# Patient Record
Sex: Male | Born: 1985 | Race: White | Hispanic: No | Marital: Married | Smoking: Never smoker
Health system: Southern US, Community
[De-identification: ages and names within clinical notes are randomized; demographics above are authoritative.]

## PROBLEM LIST (undated history)

## (undated) DIAGNOSIS — I10 Essential (primary) hypertension: Secondary | ICD-10-CM

## (undated) HISTORY — DX: Essential (primary) hypertension: I10

---

## 2017-08-22 DIAGNOSIS — L02223 Furuncle of chest wall: Secondary | ICD-10-CM | POA: Diagnosis not present

## 2017-09-26 NOTE — Progress Notes (Signed)
Subjective:    Patient ID: Jerry Dudley, male    DOB: Jan 22, 1986, 32 y.o.   MRN: [id number]  HPI:  Jerry Dudley is here to establish as a new pt.  He is a pleasant 32 year old male.  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 He drinks 4-6 glasses water/day, follows heart healthy diet, an exercises 4-5 times week; cardio and wt training.   He uses chewing tobacco- can/day- declined smoking cessation today. He estimates to drink 3-4 beers/week He feels that his PTSD is stable and denies thoughts of harming himself/others and has a strong support system of family/friends. He only has one acute complaint- developed abscess on sternum that was I/D last moth at [facility].  He completed course of Bactrim but would like a Dermatologist to examine- will refer. He served in [place] and now works as a Engineer, mining" and serves area hospital systems. He is married with a 17 month daughter, "Jerry Dudley".  Patient Care Team    Relationship Specialty Notifications Start End  [place], Berna Spare, NP PCP - General Family Medicine  09/27/17     There are no active problems to display for this patient.    Past Medical History:  Diagnosis Date  . Hypertension      History reviewed. No pertinent surgical history.   Family History  Problem Relation Age of Onset  . Healthy Mother   . Healthy Father   . Healthy Sister   . Healthy Daughter      Social History   Substance and Sexual Activity  Drug Use No     Social History   Substance and Sexual Activity  Alcohol Use Yes  . Alcohol/week: 1.2 oz  . Types: 2 Cans of beer per week     Social History   Tobacco Use  Smoking Status Never Smoker  Smokeless Tobacco Current User  . Types: Snuff     Outpatient Encounter Medications as of 09/27/2017  Medication Sig  . losartan (COZAAR) 100 MG tablet Take 50 mg by mouth daily.   No facility-administered encounter medications on file as of 09/27/2017.      Allergies: Patient has no known allergies.  Body mass index is 26.75 kg/m.  Blood pressure (!) 138/96, pulse (!) 102, height 6\' 3"  (1.905 m), weight 214 lb (97.1 kg), SpO2 100 %.      Review of Systems  Constitutional: Positive for fatigue. Negative for activity change, appetite change, chills, diaphoresis, fever and unexpected weight change.  HENT: Negative for congestion.   Eyes: Negative for visual disturbance.  Respiratory: Negative for cough, chest tightness, shortness of breath, wheezing and stridor.   Cardiovascular: Negative for chest pain, palpitations and leg swelling.  Gastrointestinal: Negative for abdominal distention, abdominal pain, blood in stool, constipation, diarrhea, nausea and vomiting.  Genitourinary: Negative for difficulty urinating and flank pain.  Skin: Positive for wound. Negative for color change, pallor and rash.       Healing sternal abscess   Neurological: Negative for dizziness and headaches.  Hematological: Does not bruise/bleed easily.  Psychiatric/Behavioral: Negative for decreased concentration, dysphoric mood, hallucinations, self-injury, sleep disturbance and suicidal ideas. The patient is not nervous/anxious and is not hyperactive.        Objective:   Physical Exam  Constitutional: He is oriented to person, place, and time. He appears well-developed and well-nourished. No distress.  Cardiovascular: Normal rate, regular rhythm, normal heart sounds and intact distal pulses.  No murmur heard. Pulmonary/Chest: Effort normal and  breath sounds normal. No respiratory distress. He has no wheezes. He has no rales. He exhibits no tenderness.  Neurological: He is alert and oriented to person, place, and time.  Skin: Skin is warm and dry. No rash noted. He is not diaphoretic. No erythema. No pallor.  Psychiatric: He has a normal mood and affect. His behavior is normal. Judgment and thought content normal.  Nursing note and vitals  reviewed.         Assessment & Plan:   1. Hypertension, unspecified type   2. Healthcare maintenance   3. Skin infection     Healthcare maintenance ncrease water intake, strive for at least 100 ounces/day.   Follow Heart Healthy diet Continue regular exercise.  Recommend at least 30 minutes daily, 5 days per week of walking, jogging, biking, swimming, YouTube/Pinterest workout videos. Continue Losartan 50mg  once daily. Please get your fasting labs from the [place] and schedule a complete physical with [place] a few weeks after- please bring lab results. Dermatology referral placed.  Skin infection Derm referral placed  Hypertension BP slightly above goal- 138/96, HR 102 Just used chewing tobacco prior to OV Continue Losartan 50mg  once daily- filled by [place] He denies acute cardiac sx's.    FOLLOW-UP:  Return in about 3 months (around 12/25/2017) for CPE.

## 2017-09-27 ENCOUNTER — Encounter: Payer: Self-pay | Admitting: Adult Health

## 2017-09-27 ENCOUNTER — Other Ambulatory Visit: Payer: Self-pay | Admitting: Adult Health

## 2017-09-27 ENCOUNTER — Ambulatory Visit (INDEPENDENT_AMBULATORY_CARE_PROVIDER_SITE_OTHER): Payer: 59 | Admitting: Adult Health

## 2017-09-27 VITALS — BP 138/96 | HR 102 | Ht 75.0 in | Wt 214.0 lb

## 2017-09-27 DIAGNOSIS — Z0000 Encounter for general adult medical examination without abnormal findings: Secondary | ICD-10-CM | POA: Diagnosis not present

## 2017-09-27 DIAGNOSIS — I10 Essential (primary) hypertension: Secondary | ICD-10-CM | POA: Diagnosis not present

## 2017-09-27 DIAGNOSIS — L089 Local infection of the skin and subcutaneous tissue, unspecified: Secondary | ICD-10-CM

## 2017-09-27 NOTE — Assessment & Plan Note (Signed)
ncrease water intake, strive for at least 100 ounces/day.   Follow Heart Healthy diet Continue regular exercise.  Recommend at least 30 minutes daily, 5 days per week of walking, jogging, biking, swimming, YouTube/Pinterest workout videos. Continue Losartan 50mg  once daily. Please get your fasting labs from the [place] and schedule a complete physical with [place] a few weeks after- please bring lab results. Dermatology referral placed.

## 2017-09-27 NOTE — Assessment & Plan Note (Signed)
Derm referral placed

## 2017-09-27 NOTE — Patient Instructions (Addendum)
Heart-Healthy Eating Plan Many factors influence your heart health, including eating and exercise habits. Heart (coronary) risk increases with abnormal blood fat (lipid) levels. Heart-healthy meal planning includes limiting unhealthy fats, increasing healthy fats, and making other small dietary changes. This includes maintaining a healthy body weight to help keep lipid levels within a normal range. What is my plan? Your health care provider recommends that you:  Get no more than __25__% of the total calories in your daily diet from fat.  Limit your intake of saturated fat to less than __5__% of your total calories each day.  Limit the amount of cholesterol in your diet to less than __300__ mg per day.  What types of fat should I choose?  Choose healthy fats more often. Choose monounsaturated and polyunsaturated fats, such as olive oil and canola oil, flaxseeds, walnuts, almonds, and seeds.  Eat more omega-3 fats. Good choices include salmon, mackerel, sardines, tuna, flaxseed oil, and ground flaxseeds. Aim to eat fish at least two times each week.  Limit saturated fats. Saturated fats are primarily found in animal products, such as meats, butter, and cream. Plant sources of saturated fats include palm oil, palm kernel oil, and coconut oil.  Avoid foods with partially hydrogenated oils in them. These contain trans fats. Examples of foods that contain trans fats are stick margarine, some tub margarines, cookies, crackers, and other baked goods. What general guidelines do I need to follow?  Check food labels carefully to identify foods with trans fats or high amounts of saturated fat.  Fill one half of your plate with vegetables and green salads. Eat 4-5 servings of vegetables per day. A serving of vegetables equals 1 cup of raw leafy vegetables,  cup of raw or cooked cut-up vegetables, or  cup of vegetable juice.  Fill one fourth of your plate with whole grains. Look for the word "whole" as  the first word in the ingredient list.  Fill one fourth of your plate with lean protein foods.  Eat 4-5 servings of fruit per day. A serving of fruit equals one medium whole fruit,  cup of dried fruit,  cup of fresh, frozen, or canned fruit, or  cup of 100% fruit juice.  Eat more foods that contain soluble fiber. Examples of foods that contain this type of fiber are apples, broccoli, carrots, beans, peas, and barley. Aim to get 20-30 g of fiber per day.  Eat more home-cooked food and less restaurant, buffet, and fast food.  Limit or avoid alcohol.  Limit foods that are high in starch and sugar.  Avoid fried foods.  Cook foods by using methods other than frying. Baking, boiling, grilling, and broiling are all great options. Other fat-reducing suggestions include: ? Removing the skin from poultry. ? Removing all visible fats from meats. ? Skimming the fat off of stews, soups, and gravies before serving them. ? Steaming vegetables in water or broth.  Lose weight if you are overweight. Losing just 5-10% of your initial body weight can help your overall health and prevent diseases such as diabetes and heart disease.  Increase your consumption of nuts, legumes, and seeds to 4-5 servings per week. One serving of dried beans or legumes equals  cup after being cooked, one serving of nuts equals 1 ounces, and one serving of seeds equals  ounce or 1 tablespoon.  You may need to monitor your salt (sodium) intake, especially if you have high blood pressure. Talk with your health care provider or dietitian to get  more information about reducing sodium. What foods can I eat? Grains  Breads, including [place], white, pita, wheat, raisin, rye, oatmeal, and [place]. Tortillas that are neither fried nor made with lard or trans fat. Low-fat rolls, including hotdog and hamburger buns and English muffins. Biscuits. Muffins. Waffles. Pancakes. Light popcorn. Whole-grain cereals. Flatbread. Melba toast.  Pretzels. Breadsticks. Rusks. Low-fat snacks and crackers, including oyster, saltine, matzo, graham, animal, and rye. Rice and pasta, including brown rice and those that are made with whole wheat. Vegetables All vegetables. Fruits All fruits, but limit coconut. Meats and Other Protein Sources Lean, well-trimmed beef, veal, pork, and lamb. Chicken and [place] without skin. All fish and shellfish. Wild duck, rabbit, pheasant, and venison. Egg whites or low-cholesterol egg substitutes. Dried beans, peas, lentils, and tofu.Seeds and most nuts. Dairy Low-fat or nonfat cheeses, including ricotta, string, and mozzarella. Skim or 1% milk that is liquid, powdered, or evaporated. Buttermilk that is made with low-fat milk. Nonfat or low-fat yogurt. Beverages Mineral water. Diet carbonated beverages. Sweets and Desserts Sherbets and fruit ices. Honey, jam, marmalade, jelly, and syrups. Meringues and gelatins. Pure sugar candy, such as hard candy, jelly beans, gumdrops, mints, marshmallows, and small amounts of dark chocolate. [employer] Eat all sweets and desserts in moderation. Fats and Oils Nonhydrogenated (trans-free) margarines. Vegetable oils, including soybean, sesame, sunflower, olive, peanut, safflower, corn, canola, and cottonseed. Salad dressings or mayonnaise that are made with a vegetable oil. Limit added fats and oils that you use for cooking, baking, salads, and as spreads. Other Cocoa powder. Coffee and tea. All seasonings and condiments. The items listed above may not be a complete list of recommended foods or beverages. Contact your dietitian for more options. What foods are not recommended? Grains Breads that are made with saturated or trans fats, oils, or whole milk. Croissants. Butter rolls. Cheese breads. Sweet rolls. Donuts. Buttered popcorn. Chow mein noodles. High-fat crackers, such as cheese or butter crackers. Meats and Other Protein Sources Fatty meats, such as hotdogs,  short ribs, sausage, spareribs, bacon, ribeye roast or steak, and mutton. High-fat deli meats, such as salami and bologna. Caviar. Domestic duck and goose. Organ meats, such as kidney, liver, sweetbreads, brains, gizzard, chitterlings, and heart. Dairy Cream, sour cream, cream cheese, and creamed cottage cheese. Whole milk cheeses, including blue (bleu), Monterey Jack, [place], [place], American, [place], Swiss, [place], [place], and [place]. Whole or 2% milk that is liquid, evaporated, or condensed. Whole buttermilk. Cream sauce or high-fat cheese sauce. Yogurt that is made from whole milk. Beverages Regular sodas and drinks with added sugar. Sweets and Desserts Frosting. Pudding. Cookies. Cakes other than angel food cake. Candy that has milk chocolate or white chocolate, hydrogenated fat, butter, coconut, or unknown ingredients. Buttered syrups. Full-fat ice cream or ice cream drinks. Fats and Oils Gravy that has suet, meat fat, or shortening. Cocoa butter, hydrogenated oils, palm oil, coconut oil, palm kernel oil. These can often be found in baked products, candy, fried foods, nondairy creamers, and whipped toppings. Solid fats and shortenings, including bacon fat, salt pork, lard, and butter. Nondairy cream substitutes, such as coffee creamers and sour cream substitutes. Salad dressings that are made of unknown oils, cheese, or sour cream. The items listed above may not be a complete list of foods and beverages to avoid. Contact your dietitian for more information. This information is not intended to replace advice given to you by your health care provider. Make sure you discuss any questions you have with your health care  provider. Document Released: 05/16/2008 Document Revised: 02/25/2016 Document Reviewed: 01/29/2014 Elsevier Interactive Patient Education  2018 [employer].   Hypertension Hypertension, commonly called high blood pressure, is when the force of blood pumping through the arteries  is too strong. The arteries are the blood vessels that carry blood from the heart throughout the body. Hypertension forces the heart to work harder to pump blood and may cause arteries to become narrow or stiff. Having untreated or uncontrolled hypertension can cause heart attacks, strokes, kidney disease, and other problems. A blood pressure reading consists of a higher number over a lower number. Ideally, your blood pressure should be below 120/80. The first ("top") number is called the systolic pressure. It is a measure of the pressure in your arteries as your heart beats. The second ("bottom") number is called the diastolic pressure. It is a measure of the pressure in your arteries as the heart relaxes. What are the causes? The cause of this condition is not known. What increases the risk? Some risk factors for high blood pressure are under your control. Others are not. Factors you can change  Smoking.  Having type 2 diabetes mellitus, high cholesterol, or both.  Not getting enough exercise or physical activity.  Being overweight.  Having too much fat, sugar, calories, or salt (sodium) in your diet.  Drinking too much alcohol. Factors that are difficult or impossible to change  Having chronic kidney disease.  Having a family history of high blood pressure.  Age. Risk increases with age.  Race. You may be at higher risk if you are African-American.  Gender. Men are at higher risk than women before age 71. After age 86, women are at higher risk than men.  Having obstructive sleep apnea.  Stress. What are the signs or symptoms? Extremely high blood pressure (hypertensive crisis) may cause:  Headache.  Anxiety.  Shortness of breath.  Nosebleed.  Nausea and vomiting.  Severe chest pain.  Jerky movements you cannot control (seizures).  How is this diagnosed? This condition is diagnosed by measuring your blood pressure while you are seated, with your arm resting on a  surface. The cuff of the blood pressure monitor will be placed directly against the skin of your upper arm at the level of your heart. It should be measured at least twice using the same arm. Certain conditions can cause a difference in blood pressure between your right and left arms. Certain factors can cause blood pressure readings to be lower or higher than normal (elevated) for a short period of time:  When your blood pressure is higher when you are in a health care provider's office than when you are at home, this is called white coat hypertension. Most people with this condition do not need medicines.  When your blood pressure is higher at home than when you are in a health care provider's office, this is called masked hypertension. Most people with this condition may need medicines to control blood pressure.  If you have a high blood pressure reading during one visit or you have normal blood pressure with other risk factors:  You may be asked to return on a different day to have your blood pressure checked again.  You may be asked to monitor your blood pressure at home for 1 week or longer.  If you are diagnosed with hypertension, you may have other blood or imaging tests to help your health care provider understand your overall risk for other conditions. How is this treated? This  condition is treated by making healthy lifestyle changes, such as eating healthy foods, exercising more, and reducing your alcohol intake. Your health care provider may prescribe medicine if lifestyle changes are not enough to get your blood pressure under control, and if:  Your systolic blood pressure is above 130.  Your diastolic blood pressure is above 80.  Your personal target blood pressure may vary depending on your medical conditions, your age, and other factors. Follow these instructions at home: Eating and drinking  Eat a diet that is high in fiber and potassium, and low in sodium, added sugar, and  fat. An example eating plan is called the DASH (Dietary Approaches to Stop Hypertension) diet. To eat this way: ? Eat plenty of fresh fruits and vegetables. Try to fill half of your plate at each meal with fruits and vegetables. ? Eat whole grains, such as whole wheat pasta, brown rice, or whole grain bread. Fill about one quarter of your plate with whole grains. ? Eat or drink low-fat dairy products, such as skim milk or low-fat yogurt. ? Avoid fatty cuts of meat, processed or cured meats, and poultry with skin. Fill about one quarter of your plate with lean proteins, such as fish, chicken without skin, beans, eggs, and tofu. ? Avoid premade and processed foods. These tend to be higher in sodium, added sugar, and fat.  Reduce your daily sodium intake. Most people with hypertension should eat less than 1,500 mg of sodium a day.  Limit alcohol intake to no more than 1 drink a day for nonpregnant women and 2 drinks a day for men. One drink equals 12 oz of beer, 5 oz of wine, or 1 oz of hard liquor. Lifestyle  Work with your health care provider to maintain a healthy body weight or to lose weight. Ask what an ideal weight is for you.  Get at least 30 minutes of exercise that causes your heart to beat faster (aerobic exercise) most days of the week. Activities may include walking, swimming, or biking.  Include exercise to strengthen your muscles (resistance exercise), such as pilates or lifting weights, as part of your weekly exercise routine. Try to do these types of exercises for 30 minutes at least 3 days a week.  Do not use any products that contain nicotine or tobacco, such as cigarettes and e-cigarettes. If you need help quitting, ask your health care provider.  Monitor your blood pressure at home as told by your health care provider.  Keep all follow-up visits as told by your health care provider. This is important. Medicines  Take over-the-counter and prescription medicines only as told  by your health care provider. Follow directions carefully. Blood pressure medicines must be taken as prescribed.  Do not skip doses of blood pressure medicine. Doing this puts you at risk for problems and can make the medicine less effective.  Ask your health care provider about side effects or reactions to medicines that you should watch for. Contact a health care provider if:  You think you are having a reaction to a medicine you are taking.  You have headaches that keep coming back (recurring).  You feel dizzy.  You have swelling in your ankles.  You have trouble with your vision. Get help right away if:  You develop a severe headache or confusion.  You have unusual weakness or numbness.  You feel faint.  You have severe pain in your chest or abdomen.  You vomit repeatedly.  You have trouble breathing.  Summary  Hypertension is when the force of blood pumping through your arteries is too strong. If this condition is not controlled, it may put you at risk for serious complications.  Your personal target blood pressure may vary depending on your medical conditions, your age, and other factors. For most people, a normal blood pressure is less than 120/80.  Hypertension is treated with lifestyle changes, medicines, or a combination of both. Lifestyle changes include weight loss, eating a healthy, low-sodium diet, exercising more, and limiting alcohol. This information is not intended to replace advice given to you by your health care provider. Make sure you discuss any questions you have with your health care provider. Document Released: 08/07/2005 Document Revised: 07/05/2016 Document Reviewed: 07/05/2016 Elsevier Interactive Patient Education  2018 [employer].  Increase water intake, strive for at least 100 ounces/day.   Follow Heart Healthy diet Continue regular exercise.  Recommend at least 30 minutes daily, 5 days per week of walking, jogging, biking, swimming,  YouTube/Pinterest workout videos. Continue Losartan 50mg  once daily. Please get your fasting labs from the [place] and schedule a complete physical with [place] a few weeks after- please bring lab results. Dermatology referral placed. WELCOME TO THE PRACTICE

## 2017-09-27 NOTE — Assessment & Plan Note (Signed)
BP slightly above goal- 138/96, HR 102 Just used chewing tobacco prior to OV Continue Losartan 50mg  once daily- filled by [place] He denies acute cardiac sx's.

## 2017-10-30 DIAGNOSIS — D485 Neoplasm of uncertain behavior of skin: Secondary | ICD-10-CM | POA: Diagnosis not present

## 2017-10-30 DIAGNOSIS — D225 Melanocytic nevi of trunk: Secondary | ICD-10-CM | POA: Diagnosis not present

## 2017-10-30 DIAGNOSIS — Z872 Personal history of diseases of the skin and subcutaneous tissue: Secondary | ICD-10-CM | POA: Diagnosis not present

## 2017-11-26 ENCOUNTER — Ambulatory Visit: Payer: 59 | Admitting: Adult Health

## 2017-11-26 DIAGNOSIS — J069 Acute upper respiratory infection, unspecified: Secondary | ICD-10-CM | POA: Diagnosis not present

## 2017-11-26 DIAGNOSIS — H7292 Unspecified perforation of tympanic membrane, left ear: Secondary | ICD-10-CM | POA: Diagnosis not present

## 2017-11-27 ENCOUNTER — Ambulatory Visit (INDEPENDENT_AMBULATORY_CARE_PROVIDER_SITE_OTHER): Payer: 59 | Admitting: Adult Health

## 2017-11-27 ENCOUNTER — Encounter: Payer: Self-pay | Admitting: Adult Health

## 2017-11-27 VITALS — BP 141/73 | HR 92 | Temp 98.9°F | Ht 75.0 in | Wt 215.6 lb

## 2017-11-27 DIAGNOSIS — R05 Cough: Secondary | ICD-10-CM | POA: Diagnosis not present

## 2017-11-27 DIAGNOSIS — R059 Cough, unspecified: Secondary | ICD-10-CM | POA: Insufficient documentation

## 2017-11-27 DIAGNOSIS — J0100 Acute maxillary sinusitis, unspecified: Secondary | ICD-10-CM | POA: Diagnosis not present

## 2017-11-27 MED ORDER — AMOXICILLIN-POT CLAVULANATE 875-125 MG PO TABS: 1.0000 | ORAL_TABLET | Freq: Two times a day (BID) | ORAL | 0 refills | Status: DC

## 2017-11-27 MED ORDER — FLUTICASONE PROPIONATE 50 MCG/ACT NA SUSP: 2.0000 | Freq: Every day | NASAL | 6 refills | Status: DC

## 2017-11-27 MED ORDER — HYDROCOD POLST-CPM POLST ER 10-8 MG/5ML PO SUER: 5.0000 mL | Freq: Two times a day (BID) | ORAL | 0 refills | Status: DC | PRN

## 2017-11-27 NOTE — Assessment & Plan Note (Signed)
Please take Augmentin as directed. Please take Tussionex and Flonase as needed. Increase fluids/rest/vit c-2,000mg /day. Alternate OTC Acetaminophen and Ibuprofen as needed for fever/pain. If symptoms persist after antibiotic completed, then please call clinic. Work Geneticist, molecular provided.

## 2017-11-27 NOTE — Progress Notes (Addendum)
Subjective:    Patient ID: Jerry Dudley, male    DOB: 24-Jul-1986, 32 y.o.   MRN: [id number]  HPI:  Jerry Dudley presents with constant, strong non-productive cough, copious thick/green nasal drainage, sore throat, fatigue, and fever- 102 yesterday. Sx's worsening >1 week  He has been taking OTC Robitussin and Theraflu with only minimal sx relief. He was seen at [facility] yesterday- Flu test negative, no given any rx's and instructed to f/u with PCP if he was not improved within 24 hrs. He denies CP/dyspnea/plapitations/N/V/D  Patient Care Team    Relationship Specialty Notifications Start End  Esaw Grandchild, NP PCP - General Family Medicine  09/27/17     Patient Active Problem List   Diagnosis Date Noted  . Healthcare maintenance 09/27/2017  . Skin infection 09/27/2017  . Hypertension 09/27/2017     Past Medical History:  Diagnosis Date  . Hypertension      History reviewed. No pertinent surgical history.   Family History  Problem Relation Age of Onset  . Healthy Mother   . Healthy Father   . Healthy Sister   . Healthy Daughter      Social History   Substance and Sexual Activity  Drug Use No     Social History   Substance and Sexual Activity  Alcohol Use Yes  . Alcohol/week: 1.2 oz  . Types: 2 Cans of beer per week     Social History   Tobacco Use  Smoking Status Never Smoker  Smokeless Tobacco Current User  . Types: Snuff     Outpatient Encounter Medications as of 11/27/2017  Medication Sig  . losartan (COZAAR) 100 MG tablet Take 50 mg by mouth daily.   No facility-administered encounter medications on file as of 11/27/2017.     Allergies: Patient has no known allergies.  Body mass index is 26.95 kg/m.  Blood pressure (!) 141/73, pulse 92, temperature 98.9 F (37.2 C), temperature source Oral, height 6\' 3"  (1.905 m), weight 215 lb 9.6 oz (97.8 kg), SpO2 97 %.    Review of Systems  Constitutional: Positive for activity change, appetite  change, fatigue and fever. Negative for chills, diaphoresis and unexpected weight change.  HENT: Positive for congestion, postnasal drip, rhinorrhea, sinus pressure, sinus pain, sneezing, sore throat and voice change.   Eyes: Negative for visual disturbance.  Respiratory: Positive for cough. Negative for chest tightness, shortness of breath, wheezing and stridor.   Cardiovascular: Negative for chest pain, palpitations and leg swelling.  Gastrointestinal: Negative for abdominal distention, abdominal pain, blood in stool, constipation, diarrhea, nausea and vomiting.  Genitourinary: Negative for difficulty urinating, flank pain and hematuria.  Neurological: Positive for headaches. Negative for dizziness.  Hematological: Does not bruise/bleed easily.  Psychiatric/Behavioral: Positive for sleep disturbance. Negative for hallucinations, self-injury and suicidal ideas. The patient is not nervous/anxious and is not hyperactive.        Objective:   Physical Exam  Constitutional: He is oriented to person, place, and time. He appears well-developed and well-nourished. He has a sickly appearance. He appears ill.  HENT:  Head: Normocephalic and atraumatic.  Right Ear: External ear normal. Tympanic membrane is bulging. Tympanic membrane is not erythematous. No decreased hearing is noted.  Left Ear: External ear normal. Tympanic membrane is scarred and bulging. Tympanic membrane is not erythematous. No decreased hearing is noted.  Well healed L TM perforation  Eyes: Pupils are equal, round, and reactive to light. Conjunctivae are normal.  Neck: Normal range of motion. Neck supple.  Cardiovascular: Normal rate, regular rhythm, normal heart sounds and intact distal pulses.  Pulmonary/Chest: Breath sounds normal. No respiratory distress. He has no wheezes. He has no rales. He exhibits no tenderness.  Lymphadenopathy:    He has no cervical adenopathy.  Neurological: He is alert and oriented to person, place,  and time.  Skin: Skin is warm and dry. No rash noted. He is not diaphoretic. No erythema. No pallor.  Very warm to the touch  Psychiatric: He has a normal mood and affect. His behavior is normal. Judgment and thought content normal.  Nursing note and vitals reviewed.     Assessment & Plan:   1. Acute maxillary sinusitis, recurrence not specified   2. Cough in adult     Cough in adult [place] Controlled Substance Database reviewed- no aberrancies noted. Use Tussionex as needed.  Acute maxillary sinusitis Please take Augmentin as directed. Please take Tussionex and Flonase as needed. Increase fluids/rest/vit c-2,000mg /day. Alternate OTC Acetaminophen and Ibuprofen as needed for fever/pain. If symptoms persist after antibiotic completed, then please call clinic. Work Geneticist, molecular provided.    FOLLOW-UP:  Return if symptoms worsen or fail to improve.

## 2017-11-27 NOTE — Patient Instructions (Signed)
Sinusitis, Adult Sinusitis is soreness and inflammation of your sinuses. Sinuses are hollow spaces in the bones around your face. Your sinuses are located:  Around your eyes.  In the middle of your forehead.  Behind your nose.  In your cheekbones.  Your sinuses and nasal passages are lined with a stringy fluid (mucus). Mucus normally drains out of your sinuses. When your nasal tissues become inflamed or swollen, the mucus can become trapped or blocked so air cannot flow through your sinuses. This allows bacteria, viruses, and funguses to grow, which leads to infection. Sinusitis can develop quickly and last for 7?10 days (acute) or for more than 12 weeks (chronic). Sinusitis often develops after a cold. What are the causes? This condition is caused by anything that creates swelling in the sinuses or stops mucus from draining, including:  Allergies.  Asthma.  Bacterial or viral infection.  Abnormally shaped bones between the nasal passages.  Nasal growths that contain mucus (nasal polyps).  Narrow sinus openings.  Pollutants, such as chemicals or irritants in the air.  A foreign object stuck in the nose.  A fungal infection. This is rare.  What increases the risk? The following factors may make you more likely to develop this condition:  Having allergies or asthma.  Having had a recent cold or respiratory tract infection.  Having structural deformities or blockages in your nose or sinuses.  Having a weak immune system.  Doing a lot of swimming or diving.  Overusing nasal sprays.  Smoking.  What are the signs or symptoms? The main symptoms of this condition are pain and a feeling of pressure around the affected sinuses. Other symptoms include:  Upper toothache.  Earache.  Headache.  Bad breath.  Decreased sense of smell and taste.  A cough that may get worse at night.  Fatigue.  Fever.  Thick drainage from your nose. The drainage is often green and  it may contain pus (purulent).  Stuffy nose or congestion.  Postnasal drip. This is when extra mucus collects in the throat or back of the nose.  Swelling and warmth over the affected sinuses.  Sore throat.  Sensitivity to light.  How is this diagnosed? This condition is diagnosed based on symptoms, a medical history, and a physical exam. To find out if your condition is acute or chronic, your health care provider may:  Look in your nose for signs of nasal polyps.  Tap over the affected sinus to check for signs of infection.  View the inside of your sinuses using an imaging device that has a light attached (endoscope).  If your health care provider suspects that you have chronic sinusitis, you may also:  Be tested for allergies.  Have a sample of mucus taken from your nose (nasal culture) and checked for bacteria.  Have a mucus sample examined to see if your sinusitis is related to an allergy.  If your sinusitis does not respond to treatment and it lasts longer than 8 weeks, you may have an MRI or CT scan to check your sinuses. These scans also help to determine how severe your infection is. In rare cases, a bone biopsy may be done to rule out more serious types of fungal sinus disease. How is this treated? Treatment for sinusitis depends on the cause and whether your condition is chronic or acute. If a virus is causing your sinusitis, your symptoms will go away on their own within 10 days. You may be given medicines to relieve your symptoms,   including:  Topical nasal decongestants. They shrink swollen nasal passages and let mucus drain from your sinuses.  Antihistamines. These drugs block inflammation that is triggered by allergies. This can help to ease swelling in your nose and sinuses.  Topical nasal corticosteroids. These are nasal sprays that ease inflammation and swelling in your nose and sinuses.  Nasal saline washes. These rinses can help to get rid of thick mucus in  your nose.  If your condition is caused by bacteria, you will be given an antibiotic medicine. If your condition is caused by a fungus, you will be given an antifungal medicine. Surgery may be needed to correct underlying conditions, such as narrow nasal passages. Surgery may also be needed to remove polyps. Follow these instructions at home: Medicines  Take, use, or apply over-the-counter and prescription medicines only as told by your health care provider. These may include nasal sprays.  If you were prescribed an antibiotic medicine, take it as told by your health care provider. Do not stop taking the antibiotic even if you start to feel better. Hydrate and Humidify  Drink enough water to keep your urine clear or pale yellow. Staying hydrated will help to thin your mucus.  Use a cool mist humidifier to keep the humidity level in your home above 50%.  Inhale steam for 10-15 minutes, 3-4 times a day or as told by your health care provider. You can do this in the bathroom while a hot shower is running.  Limit your exposure to cool or dry air. Rest  Rest as much as possible.  Sleep with your head raised (elevated).  Make sure to get enough sleep each night. General instructions  Apply a warm, moist washcloth to your face 3-4 times a day or as told by your health care provider. This will help with discomfort.  Wash your hands often with soap and water to reduce your exposure to viruses and other germs. If soap and water are not available, use hand sanitizer.  Do not smoke. Avoid being around people who are smoking (secondhand smoke).  Keep all follow-up visits as told by your health care provider. This is important. Contact a health care provider if:  You have a fever.  Your symptoms get worse.  Your symptoms do not improve within 10 days. Get help right away if:  You have a severe headache.  You have persistent vomiting.  You have pain or swelling around your face or  eyes.  You have vision problems.  You develop confusion.  Your neck is stiff.  You have trouble breathing. This information is not intended to replace advice given to you by your health care provider. Make sure you discuss any questions you have with your health care provider. Document Released: 08/07/2005 Document Revised: 04/02/2016 Document Reviewed: 06/02/2015 Elsevier Interactive Patient Education  2018 [place].   Cough, Adult Coughing is a reflex that clears your throat and your airways. Coughing helps to heal and protect your lungs. It is normal to cough occasionally, but a cough that happens with other symptoms or lasts a long time may be a sign of a condition that needs treatment. A cough may last only 2-3 weeks (acute), or it may last longer than 8 weeks (chronic). What are the causes? Coughing is commonly caused by:  Breathing in substances that irritate your lungs.  A viral or bacterial respiratory infection.  Allergies.  Asthma.  Postnasal drip.  Smoking.  Acid backing up from the stomach into the esophagus (  gastroesophageal reflux).  Certain medicines.  Chronic lung problems, including COPD (or rarely, lung cancer).  Other medical conditions such as heart failure.  Follow these instructions at home: Pay attention to any changes in your symptoms. Take these actions to help with your discomfort:  Take medicines only as told by your health care provider. ? If you were prescribed an antibiotic medicine, take it as told by your health care provider. Do not stop taking the antibiotic even if you start to feel better. ? Talk with your health care provider before you take a cough suppressant medicine.  Drink enough fluid to keep your urine clear or pale yellow.  If the air is dry, use a cold steam vaporizer or humidifier in your bedroom or your home to help loosen secretions.  Avoid anything that causes you to cough at work or at home.  If your cough is  worse at night, try sleeping in a semi-upright position.  Avoid cigarette smoke. If you smoke, quit smoking. If you need help quitting, ask your health care provider.  Avoid caffeine.  Avoid alcohol.  Rest as needed.  Contact a health care provider if:  You have new symptoms.  You cough up pus.  Your cough does not get better after 2-3 weeks, or your cough gets worse.  You cannot control your cough with suppressant medicines and you are losing sleep.  You develop pain that is getting worse or pain that is not controlled with pain medicines.  You have a fever.  You have unexplained weight loss.  You have night sweats. Get help right away if:  You cough up blood.  You have difficulty breathing.  Your heartbeat is very fast. This information is not intended to replace advice given to you by your health care provider. Make sure you discuss any questions you have with your health care provider. Document Released: 02/03/2011 Document Revised: 01/13/2016 Document Reviewed: 10/14/2014 Elsevier Interactive Patient Education  2018 [employer].   Please take Augmentin as directed. Please take Tussionex and Flonase as needed. Increase fluids/rest/vit c-2,000mg /day. Alternate OTC Acetaminophen and Ibuprofen as needed for fever/pain. If symptoms persist after antibiotic completed, then please call clinic. Work Geneticist, molecular provided. FEEL BETTER!

## 2017-11-27 NOTE — Assessment & Plan Note (Signed)
[place] Controlled Substance Database reviewed- no aberrancies noted. Use Tussionex as needed.

## 2017-12-10 ENCOUNTER — Encounter: Payer: Self-pay | Admitting: Adult Health

## 2017-12-10 ENCOUNTER — Ambulatory Visit (INDEPENDENT_AMBULATORY_CARE_PROVIDER_SITE_OTHER): Payer: 59 | Admitting: Adult Health

## 2017-12-10 VITALS — BP 136/69 | HR 93 | Temp 98.8°F | Ht 75.0 in | Wt 214.5 lb

## 2017-12-10 DIAGNOSIS — R590 Localized enlarged lymph nodes: Secondary | ICD-10-CM

## 2017-12-10 DIAGNOSIS — R591 Generalized enlarged lymph nodes: Secondary | ICD-10-CM | POA: Insufficient documentation

## 2017-12-10 NOTE — Patient Instructions (Signed)
Lymphadenopathy Lymphadenopathy refers to swollen or enlarged lymph glands, also called lymph nodes. Lymph glands are part of your body's defense (immune) system, which protects the body from infections, germs, and diseases. Lymph glands are found in many locations in your body, including the neck, underarm, and groin. Many things can cause lymph glands to become enlarged. When your immune system responds to germs, such as viruses or bacteria, infection-fighting cells and fluid build up. This causes the glands to grow in size. Usually, this is not something to worry about. The swelling and any soreness often go away without treatment. However, swollen lymph glands can also be caused by a number of diseases. Your health care provider may do various tests to help determine the cause. If the cause of your swollen lymph glands cannot be found, it is important to monitor your condition to make sure the swelling goes away. Follow these instructions at home: Watch your condition for any changes. The following actions may help to lessen any discomfort you are feeling:  Get plenty of rest.  Take medicines only as directed by your health care provider. Your health care provider may recommend over-the-counter medicines for pain.  Apply moist heat compresses to the site of swollen lymph nodes as directed by your health care provider. This can help reduce any pain.  Check your lymph nodes daily for any changes.  Keep all follow-up visits as directed by your health care provider. This is important.  Contact a health care provider if:  Your lymph nodes are still swollen after 2 weeks.  Your swelling increases or spreads to other areas.  Your lymph nodes are hard, seem fixed to the skin, or are growing rapidly.  Your skin over the lymph nodes is red and inflamed.  You have a fever.  You have chills.  You have fatigue.  You develop a sore throat.  You have abdominal pain.  You have weight  loss.  You have night sweats. Get help right away if:  You notice fluid leaking from the area of the enlarged lymph node.  You have severe pain in any area of your body.  You have chest pain.  You have shortness of breath. This information is not intended to replace advice given to you by your health care provider. Make sure you discuss any questions you have with your health care provider. Document Released: 05/16/2008 Document Revised: 01/13/2016 Document Reviewed: 03/12/2014 Elsevier Interactive Patient Education  2018 [place] [employer]. If lymph node is still swollen in 2 weeks, please call clinic and will send you for [place] of neck.   In future, complete entire course of antibiotics. Please call clinic with any questions/concerns. NICE TO SEE YOU!

## 2017-12-10 NOTE — Assessment & Plan Note (Signed)
Push fluids. If lymph node is still swollen in 2 weeks, please call clinic and will send you for [place] of neck.   In future, complete entire course of antibiotics. Please call clinic with any questions/concerns.

## 2017-12-10 NOTE — Progress Notes (Addendum)
Subjective:    Patient ID: Jerry Dudley, male    DOB: 06/15/1986, 32 y.o.   MRN: [id number]  HPI: Jerry Dudley presents with "mass on R side my neck" that he noticed 12/05/17.  Mass was larger and mildly tender to the touch last week but has now reduced in size and non-tender when palpated. He was treated for acute sinusitis 11/27/17, provided 10 day course of Augmentin. He reports only taking 5 days of abx,then stopped b'c "I felt much better". He reports intermittent non-productive cough. He denies fever/night sweats/chills/unexplained wt loss/N/V/D  Patient Care Team    Relationship Specialty Notifications Start End  Esaw Grandchild, NP PCP - General Family Medicine  09/27/17     Patient Active Problem List   Diagnosis Date Noted  . Enlarged lymph node in neck 12/10/2017  . Acute maxillary sinusitis 11/27/2017  . Cough in adult 11/27/2017  . Healthcare maintenance 09/27/2017  . Skin infection 09/27/2017  . Hypertension 09/27/2017     Past Medical History:  Diagnosis Date  . Hypertension      History reviewed. No pertinent surgical history.   Family History  Problem Relation Age of Onset  . Healthy Mother   . Healthy Father   . Healthy Sister   . Healthy Daughter      Social History   Substance and Sexual Activity  Drug Use No     Social History   Substance and Sexual Activity  Alcohol Use Yes  . Alcohol/week: 1.2 oz  . Types: 2 Cans of beer per week     Social History   Tobacco Use  Smoking Status Never Smoker  Smokeless Tobacco Current User  . Types: Snuff     Outpatient Encounter Medications as of 12/10/2017  Medication Sig  . losartan (COZAAR) 100 MG tablet Take 50 mg by mouth daily.  . [DISCONTINUED] amoxicillin-clavulanate (AUGMENTIN) 875-125 MG tablet Take 1 tablet by mouth 2 (two) times daily.  . [DISCONTINUED] chlorpheniramine-HYDROcodone (TUSSIONEX) 10-8 MG/5ML SUER Take 5 mLs by mouth every 12 (twelve) hours as needed for cough.  .  [DISCONTINUED] fluticasone (FLONASE) 50 MCG/ACT nasal spray Place 2 sprays into both nostrils daily.   No facility-administered encounter medications on file as of 12/10/2017.     Allergies: Patient has no known allergies.  Body mass index is 26.81 kg/m.  Blood pressure 136/69, pulse 93, temperature 98.8 F (37.1 C), temperature source Oral, height 6\' 3"  (1.905 m), weight 214 lb 8 oz (97.3 kg).     Review of Systems  Constitutional: Negative for activity change, appetite change, chills, diaphoresis, fatigue, fever and unexpected weight change.  HENT: Negative for congestion and rhinorrhea.   Eyes: Negative for visual disturbance.  Respiratory: Positive for cough. Negative for chest tightness, shortness of breath, wheezing and stridor.   Cardiovascular: Negative for chest pain, palpitations and leg swelling.  Gastrointestinal: Negative for abdominal distention, abdominal pain, blood in stool, constipation, diarrhea, nausea and vomiting.  Endocrine: Negative for cold intolerance, heat intolerance, polydipsia, polyphagia and polyuria.  Neurological: Negative for dizziness and headaches.  Hematological: Does not bruise/bleed easily.       Objective:   Physical Exam  Constitutional: He is oriented to person, place, and time. He appears well-developed and well-nourished. No distress.  HENT:  Head: Normocephalic and atraumatic.  Right Ear: Hearing, tympanic membrane, external ear and ear canal normal. Tympanic membrane is not erythematous and not bulging. No decreased hearing is noted.  Left Ear: Hearing, tympanic membrane, external ear and  ear canal normal. Tympanic membrane is not erythematous and not bulging. No decreased hearing is noted.  Nose: No mucosal edema. Right sinus exhibits no maxillary sinus tenderness and no frontal sinus tenderness. Left sinus exhibits no maxillary sinus tenderness and no frontal sinus tenderness.  Mouth/Throat: Uvula is midline, oropharynx is clear and  moist and mucous membranes are normal.  Eyes: Pupils are equal, round, and reactive to light. Conjunctivae are normal.  Neck: Normal range of motion. Neck supple. No thyromegaly present.    One discrete, mobile, non-tender swollen lymph node on R anterior cervical neck  Cardiovascular: Normal rate, regular rhythm, normal heart sounds and intact distal pulses.  No murmur heard. Pulmonary/Chest: Effort normal and breath sounds normal. No respiratory distress. He has no wheezes. He has no rales. He exhibits no tenderness.  Lymphadenopathy:    He has cervical adenopathy.  Neurological: He is alert and oriented to person, place, and time.  Skin: Skin is warm and dry. No rash noted. He is not diaphoretic. No erythema. No pallor.  Psychiatric: He has a normal mood and affect. His behavior is normal. Judgment and thought content normal.  Nursing note and vitals reviewed.     Assessment & Plan:   1. Enlarged lymph node in neck     Enlarged lymph node in neck Push fluids. If lymph node is still swollen in 2 weeks, please call clinic and will send you for [place] of neck.   In future, complete entire course of antibiotics. Please call clinic with any questions/concerns.    FOLLOW-UP:  Return if symptoms worsen or fail to improve.

## 2018-01-02 ENCOUNTER — Telehealth: Payer: Self-pay | Admitting: Adult Health

## 2018-01-02 NOTE — Telephone Encounter (Signed)
FYI

## 2018-01-02 NOTE — Telephone Encounter (Signed)
Patient called wanted to report to Provider swelling in going down on Rt side of throat where Lymph nodes were enlarged--- pls call him if there are any questions-- Patient was told to call office to report any changes.  --forwarding message to medical assistant.  --Jerry Dudley

## 2018-05-28 ENCOUNTER — Ambulatory Visit (INDEPENDENT_AMBULATORY_CARE_PROVIDER_SITE_OTHER): Payer: 59 | Admitting: Adult Health

## 2018-05-28 ENCOUNTER — Encounter: Payer: Self-pay | Admitting: Adult Health

## 2018-05-28 VITALS — BP 119/78 | HR 75 | Ht 75.0 in | Wt 208.8 lb

## 2018-05-28 DIAGNOSIS — R591 Generalized enlarged lymph nodes: Secondary | ICD-10-CM

## 2018-05-28 NOTE — Patient Instructions (Signed)
Lymphadenopathy Lymphadenopathy refers to swollen or enlarged lymph glands, also called lymph nodes. Lymph glands are part of your body's defense (immune) system, which protects the body from infections, germs, and diseases. Lymph glands are found in many locations in your body, including the neck, underarm, and groin. Many things can cause lymph glands to become enlarged. When your immune system responds to germs, such as viruses or bacteria, infection-fighting cells and fluid build up. This causes the glands to grow in size. Usually, this is not something to worry about. The swelling and any soreness often go away without treatment. However, swollen lymph glands can also be caused by a number of diseases. Your health care provider may do various tests to help determine the cause. If the cause of your swollen lymph glands cannot be found, it is important to monitor your condition to make sure the swelling goes away. Follow these instructions at home: Watch your condition for any changes. The following actions may help to lessen any discomfort you are feeling:  Get plenty of rest.  Take medicines only as directed by your health care provider. Your health care provider may recommend over-the-counter medicines for pain.  Apply moist heat compresses to the site of swollen lymph nodes as directed by your health care provider. This can help reduce any pain.  Check your lymph nodes daily for any changes.  Keep all follow-up visits as directed by your health care provider. This is important.  Contact a health care provider if:  Your lymph nodes are still swollen after 2 weeks.  Your swelling increases or spreads to other areas.  Your lymph nodes are hard, seem fixed to the skin, or are growing rapidly.  Your skin over the lymph nodes is red and inflamed.  You have a fever.  You have chills.  You have fatigue.  You develop a sore throat.  You have abdominal pain.  You have weight  loss.  You have night sweats. Get help right away if:  You notice fluid leaking from the area of the enlarged lymph node.  You have severe pain in any area of your body.  You have chest pain.  You have shortness of breath. This information is not intended to replace advice given to you by your health care provider. Make sure you discuss any questions you have with your health care provider. Document Released: 05/16/2008 Document Revised: 01/13/2016 Document Reviewed: 03/12/2014 Elsevier Interactive Patient Education  2018 [employer]   US Soft Tissue Neck Ordered. We will you with results ASAP. NICE TO SEE YOU!

## 2018-05-28 NOTE — Progress Notes (Signed)
Subjective:    Patient ID: Jerry Dudley, male    DOB: 1986/08/10, 32 y.o.   MRN: [id number]  HPI:12/10/17 OV:  Jerry Dudley presents with "mass on R side my neck" that he noticed 12/05/17.  Mass was larger and mildly tender to the touch last week but has now reduced in size and non-tender when palpated. He was treated for acute sinusitis 11/27/17, provided 10 day course of Augmentin. He reports only taking 5 days of abx,then stopped b'c "I felt much better". He reports intermittent non-productive cough. He denies fever/night sweats/chills/unexplained wt loss/N/V/D  05/28/18 OV: Jerry Dudley presents with continued swollen R anterior cervical lymphadenopathy. He denies fever/night sweats/chills/unexplained wt loss/N/V/D He has lost 8 lbs since last OV, however that is attributed to not weight lifting in the last 6 weeks. He is concerned "with what I found on google in regard to swollen lymph nodes". He denies family hx of lymphoma's  Patient Care Team    Relationship Specialty Notifications Start End  Keiyana Stehr, Berna Spare, NP PCP - General Family Medicine  09/27/17     Patient Active Problem List   Diagnosis Date Noted  . Lymphadenopathy 12/10/2017  . Acute maxillary sinusitis 11/27/2017  . Cough in adult 11/27/2017  . Healthcare maintenance 09/27/2017  . Skin infection 09/27/2017  . Hypertension 09/27/2017     Past Medical History:  Diagnosis Date  . Hypertension      History reviewed. No pertinent surgical history.   Family History  Problem Relation Age of Onset  . Healthy Mother   . Healthy Father   . Healthy Sister   . Healthy Daughter      Social History   Substance and Sexual Activity  Drug Use No     Social History   Substance and Sexual Activity  Alcohol Use Yes  . Alcohol/week: 2.0 standard drinks  . Types: 2 Cans of beer per week     Social History   Tobacco Use  Smoking Status Never Smoker  Smokeless Tobacco Current User  . Types: Snuff      Outpatient Encounter Medications as of 05/28/2018  Medication Sig  . losartan (COZAAR) 100 MG tablet Take 50 mg by mouth daily.   No facility-administered encounter medications on file as of 05/28/2018.     Allergies: Patient has no known allergies.  Body mass index is 26.1 kg/m.  Blood pressure 119/78, pulse 75, height 6\' 3"  (1.905 m), weight 208 lb 12.8 oz (94.7 kg), SpO2 98 %.  Review of Systems  Constitutional: Negative for activity change, appetite change, chills, diaphoresis, fatigue, fever and unexpected weight change.  HENT: Negative for congestion and rhinorrhea.   Eyes: Negative for visual disturbance.  Respiratory: Negative for cough, chest tightness, shortness of breath, wheezing and stridor.   Cardiovascular: Negative for chest pain, palpitations and leg swelling.  Gastrointestinal: Negative for abdominal distention, abdominal pain, blood in stool, constipation, diarrhea, nausea and vomiting.  Endocrine: Negative for cold intolerance, heat intolerance, polydipsia, polyphagia and polyuria.  Neurological: Negative for dizziness and headaches.  Hematological: Does not bruise/bleed easily.       Objective:   Physical Exam  Constitutional: He is oriented to person, place, and time. He appears well-developed and well-nourished. No distress.  HENT:  Head: Normocephalic and atraumatic.  Right Ear: Hearing, tympanic membrane, external ear and ear canal normal. Tympanic membrane is not erythematous and not bulging. No decreased hearing is noted.  Left Ear: Hearing, tympanic membrane, external ear and ear canal normal. Tympanic  membrane is not erythematous and not bulging. No decreased hearing is noted.  Nose: No mucosal edema. Right sinus exhibits no maxillary sinus tenderness and no frontal sinus tenderness. Left sinus exhibits no maxillary sinus tenderness and no frontal sinus tenderness.  Mouth/Throat: Uvula is midline, oropharynx is clear and moist and mucous  membranes are normal.  Eyes: Pupils are equal, round, and reactive to light. Conjunctivae are normal.  Neck: Normal range of motion. Neck supple. No thyromegaly present.    One discrete, mobile, non-tender swollen lymph node on R anterior cervical neck  Cardiovascular: Normal rate, regular rhythm, normal heart sounds and intact distal pulses.  No murmur heard. Pulmonary/Chest: Effort normal and breath sounds normal. No respiratory distress. He has no wheezes. He has no rales. He exhibits no tenderness.  Lymphadenopathy:    He has cervical adenopathy.  Neurological: He is alert and oriented to person, place, and time.  Skin: Skin is warm and dry. No rash noted. He is not diaphoretic. No erythema. No pallor.  Psychiatric: He has a normal mood and affect. His behavior is normal. Judgment and thought content normal.  Nursing note and vitals reviewed.     Assessment & Plan:   1. Lymphadenopathy     Lymphadenopathy US Soft Tissue Neck Ordered. We will you with results ASAP.    FOLLOW-UP:  Return if symptoms worsen or fail to improve.

## 2018-05-28 NOTE — Assessment & Plan Note (Signed)
US Soft Tissue Neck Ordered. We will you with results ASAP.

## 2018-05-29 ENCOUNTER — Ambulatory Visit
Admission: RE | Admit: 2018-05-29 | Discharge: 2018-05-29 | Disposition: A | Discharge status: Home / Self Care | Payer: 59 | Source: Ambulatory Visit | Attending: Adult Health | Admitting: Adult Health

## 2018-05-29 DIAGNOSIS — R591 Generalized enlarged lymph nodes: Secondary | ICD-10-CM

## 2018-05-29 DIAGNOSIS — R599 Enlarged lymph nodes, unspecified: Secondary | ICD-10-CM | POA: Diagnosis not present

## 2018-05-30 ENCOUNTER — Telehealth: Payer: Self-pay | Admitting: Adult Health

## 2018-05-30 ENCOUNTER — Other Ambulatory Visit: Payer: Self-pay | Admitting: Adult Health

## 2018-05-30 DIAGNOSIS — R590 Localized enlarged lymph nodes: Secondary | ICD-10-CM

## 2018-05-30 DIAGNOSIS — R591 Generalized enlarged lymph nodes: Secondary | ICD-10-CM

## 2018-05-30 NOTE — Telephone Encounter (Signed)
Pt informed of results.  Pt expressed understanding and is agreeable.  T. Nelson, CMA 

## 2018-05-30 NOTE — Telephone Encounter (Signed)
Patient called to see if his results are ready from the [place] he took yesterday, Oct 9/19.  --forwarding message to medical assistant to contact patient.  --glh

## 2018-05-31 ENCOUNTER — Other Ambulatory Visit: Payer: Self-pay

## 2018-05-31 DIAGNOSIS — R591 Generalized enlarged lymph nodes: Secondary | ICD-10-CM

## 2018-06-11 ENCOUNTER — Other Ambulatory Visit: Payer: Self-pay | Admitting: Student

## 2018-06-12 ENCOUNTER — Encounter (HOSPITAL_COMMUNITY): Payer: Self-pay

## 2018-06-12 ENCOUNTER — Ambulatory Visit (HOSPITAL_COMMUNITY)
Admission: RE | Admit: 2018-06-12 | Discharge: 2018-06-12 | Disposition: A | Discharge status: Home / Self Care | Payer: 59 | Source: Ambulatory Visit | Attending: Adult Health | Admitting: Adult Health

## 2018-06-12 ENCOUNTER — Other Ambulatory Visit: Payer: Self-pay

## 2018-06-12 DIAGNOSIS — I10 Essential (primary) hypertension: Secondary | ICD-10-CM | POA: Diagnosis not present

## 2018-06-12 DIAGNOSIS — Z79899 Other long term (current) drug therapy: Secondary | ICD-10-CM | POA: Insufficient documentation

## 2018-06-12 DIAGNOSIS — R591 Generalized enlarged lymph nodes: Secondary | ICD-10-CM

## 2018-06-12 DIAGNOSIS — Z87898 Personal history of other specified conditions: Secondary | ICD-10-CM | POA: Insufficient documentation

## 2018-06-12 DIAGNOSIS — R590 Localized enlarged lymph nodes: Secondary | ICD-10-CM | POA: Diagnosis present

## 2018-06-12 LAB — PROTIME-INR
INR: 1
PROTHROMBIN TIME: 13.1 s (ref 11.4–15.2)

## 2018-06-12 LAB — CBC
HEMATOCRIT: 47.1 % (ref 39.0–52.0)
Hemoglobin: 15 g/dL (ref 13.0–17.0)
MCH: 28.8 pg (ref 26.0–34.0)
MCHC: 31.8 g/dL (ref 30.0–36.0)
MCV: 90.6 fL (ref 80.0–100.0)
NRBC: 0 % (ref 0.0–0.2)
PLATELETS: 296 10*3/uL (ref 150–400)
RBC: 5.2 MIL/uL (ref 4.22–5.81)
RDW: 13.2 % (ref 11.5–15.5)
WBC: 7.4 10*3/uL (ref 4.0–10.5)

## 2018-06-12 LAB — APTT: APTT: 27 s (ref 24–36)

## 2018-06-12 MED ORDER — MIDAZOLAM HCL 2 MG/2ML IJ SOLN
INTRAMUSCULAR | Status: AC
  Filled 2018-06-12: qty 2

## 2018-06-12 MED ORDER — FENTANYL CITRATE (PF) 100 MCG/2ML IJ SOLN
INTRAMUSCULAR | Status: AC
  Filled 2018-06-12: qty 2

## 2018-06-12 MED ORDER — SODIUM CHLORIDE 0.9 % IV SOLN: INTRAVENOUS | Status: DC

## 2018-06-12 MED ORDER — MIDAZOLAM HCL 2 MG/2ML IJ SOLN
INTRAMUSCULAR | Status: AC | PRN
  Administered 2018-06-12: 1 mg via INTRAVENOUS

## 2018-06-12 MED ORDER — LIDOCAINE HCL (PF) 1 % IJ SOLN
INTRAMUSCULAR | Status: AC
  Filled 2018-06-12: qty 30

## 2018-06-12 NOTE — Discharge Instructions (Signed)
Needle Biopsy, Care After Refer to this sheet in the next few weeks. These instructions provide you with information about caring for yourself after your procedure. Your health care provider may also give you more specific instructions. Your treatment has been planned according to current medical practices, but problems sometimes occur. Call your health care provider if you have any problems or questions after your procedure. What can I expect after the procedure? After your procedure, it is common to have soreness, bruising, or mild pain at the biopsy site. This should go away in a few days. Follow these instructions at home:  Rest as directed by your health care provider.  Take medicines only as directed by your health care provider.  There are many different ways to close and cover the biopsy site, including stitches (sutures), skin glue, and adhesive strips. Follow your health care provider's instructions about: ? Biopsy site care. ? Bandage (dressing) removal in 24 hours. Check your biopsy site every day for signs of infection. Watch for: ? Redness, swelling, or pain. ? Fluid, blood, or pus. Contact a health care provider if:  You have a fever.  You have redness, swelling, or pain at the biopsy site that lasts longer than a few days.  You have fluid, blood, or pus coming from the biopsy site.  You feel nauseous.  You vomit. Get help right away if:  You have shortness of breath.  You have trouble breathing.  You have chest pain.  You feel dizzy or you faint.  You have bleeding that does not stop with pressure or a bandage.  You cough up blood.  You have pain in your abdomen. This information is not intended to replace advice given to you by your health care provider. Make sure you discuss any questions you have with your health care provider. Document Released: 12/22/2014 Document Revised: 01/13/2016 Document Reviewed: 08/03/2014 Elsevier Interactive Patient Education   2018 [place].  Moderate Conscious Sedation, Adult, Care After These instructions provide you with information about caring for yourself after your procedure. Your health care provider may also give you more specific instructions. Your treatment has been planned according to current medical practices, but problems sometimes occur. Call your health care provider if you have any problems or questions after your procedure. What can I expect after the procedure? After your procedure, it is common:  To feel sleepy for several hours.  To feel clumsy and have poor balance for several hours.  To have poor judgment for several hours.  To vomit if you eat too soon.  Follow these instructions at home: For at least 24 hours after the procedure:   Do not: ? Participate in activities where you could fall or become injured. ? Drive. ? Use heavy machinery. ? Drink alcohol. ? Take sleeping pills or medicines that cause drowsiness. ? Make important decisions or sign legal documents. ? Take care of children on your own.  Rest. Eating and drinking  Follow the diet recommended by your health care provider.  If you vomit: ? Drink water, juice, or soup when you can drink without vomiting. ? Make sure you have little or no nausea before eating solid foods. General instructions  Have a responsible adult stay with you until you are awake and alert.  Take over-the-counter and prescription medicines only as told by your health care provider.  If you smoke, do not smoke without supervision.  Keep all follow-up visits as told by your health care provider. This is important.  Contact a health care provider if:  You keep feeling nauseous or you keep vomiting.  You feel light-headed.  You develop a rash.  You have a fever. Get help right away if:  You have trouble breathing. This information is not intended to replace advice given to you by your health care provider. Make sure you  discuss any questions you have with your health care provider. Document Released: 05/28/2013 Document Revised: 01/10/2016 Document Reviewed: 11/27/2015 Elsevier Interactive Patient Education  [employer].

## 2018-06-12 NOTE — Procedures (Signed)
Interventional Radiology Procedure Note  Procedure: US guided right submandibular lymph node FNA.  Complications: None Recommendations:  - Ok to shower tomorrow - Do not submerge for 7 days - Routine care   Signed,  Dulcy Fanny. Earleen Newport, DO   t

## 2018-06-12 NOTE — H&P (Signed)
Chief Complaint: Patient was seen in consultation today for right submandibular node biopsy at the request of Danford,Katy D  Referring Physician(s): Danford,Katy D  Supervising Physician: Corrie Mckusick  Patient Status: [facility] - Out-pt  History of Present Illness: Jerry Dudley is a 32 y.o. male   Sinus infection 6-8 months ago LAN secondary infection per MD Noted continued enlargement of right submandibular node even 6 mo later + smokeless tobacco  [place] 05/29/18:  IMPRESSION: 1.2 cm submandibular lymph node on the right. Ultrasound appearance is nonspecific but this may be a benign reactive node. If this shows interval growth, biopsy should be considered.  Now scheduled for bx of same   Past Medical History:  Diagnosis Date  . Hypertension     History reviewed. No pertinent surgical history.  Allergies: Patient has no known allergies.  Medications: Prior to Admission medications   Medication Sig Start Date End Date Taking? Authorizing Provider  losartan (COZAAR) 100 MG tablet Take 50 mg by mouth daily.   Yes [provider]  Polyethyl Glycol-Propyl Glycol (LUBRICANT EYE DROPS) 0.4-0.3 % SOLN Place 1 drop into both eyes 3 (three) times daily as needed (for dry/irritated eyes.).   Yes [provider]     Family History  Problem Relation Age of Onset  . Healthy Mother   . Healthy Father   . Healthy Sister   . Healthy Daughter     Social History   Socioeconomic History  . Marital status: Married    Spouse name: Not on file  . Number of children: Not on file  . Years of education: Not on file  . Highest education level: Not on file  Occupational History  . Not on file  Social Needs  . Financial resource strain: Not on file  . Food insecurity:    Worry: Not on file    Inability: Not on file  . Transportation needs:    Medical: Not on file    Non-medical: Not on file  Tobacco Use  . Smoking status: Never Smoker  . Smokeless tobacco:  Current User    Types: Snuff  Substance and Sexual Activity  . Alcohol use: Yes    Alcohol/week: 2.0 standard drinks    Types: 2 Cans of beer per week  . Drug use: No  . Sexual activity: Yes    Birth control/protection: Inserts  Lifestyle  . Physical activity:    Days per week: Not on file    Minutes per session: Not on file  . Stress: Not on file  Relationships  . Social connections:    Talks on phone: Not on file    Gets together: Not on file    Attends religious service: Not on file    Active member of club or organization: Not on file    Attends meetings of clubs or organizations: Not on file    Relationship status: Not on file  Other Topics Concern  . Not on file  Social History Narrative  . Not on file     Review of Systems: A 12 point ROS discussed and pertinent positives are indicated in the HPI above.  All other systems are negative.  Review of Systems  Constitutional: Negative for activity change, fatigue and fever.  HENT: Negative for ear pain, facial swelling and sore throat.   Respiratory: Negative for cough and shortness of breath.   Cardiovascular: Negative for chest pain.  Gastrointestinal: Negative for abdominal pain.  Psychiatric/Behavioral: Negative for behavioral problems and confusion.  Vital Signs: BP (!) 148/104   Pulse 85   Temp 98.2 F (36.8 C)   Ht 6\' 3"  (1.905 m)   Wt 215 lb (97.5 kg)   SpO2 100%   BMI 26.87 kg/m   Physical Exam  Constitutional: He is oriented to person, place, and time.  Neck: Neck supple. No thyromegaly present.  Barely palpable right sub mandibular node  Cardiovascular: Normal rate, regular rhythm and normal heart sounds.  Pulmonary/Chest: Effort normal and breath sounds normal.  Abdominal: Soft. Bowel sounds are normal.  Musculoskeletal: Normal range of motion.  Lymphadenopathy:    He has cervical adenopathy.  Neurological: He is alert and oriented to person, place, and time.  Skin: Skin is warm and dry.    Psychiatric: He has a normal mood and affect. His behavior is normal. Judgment and thought content normal.  Nursing note and vitals reviewed.   Imaging: US Soft Tissue Head/neck  Result Date: 05/30/2018 CLINICAL DATA:  Right neck adenopathy EXAM: ULTRASOUND OF HEAD/NECK SOFT TISSUES TECHNIQUE: Ultrasound examination of the head and neck soft tissues was performed in the area of clinical concern. COMPARISON:  None. FINDINGS: Palpable abnormality in the right submandibular region was scanned. This corresponds to a hypoechoic lymph node measuring 1.2 cm in short axis dimension. Additional smaller normal nodes are present in the right neck. IMPRESSION: 1.2 cm submandibular lymph node on the right. Ultrasound appearance is nonspecific but this may be a benign reactive node. If this shows interval growth, biopsy should be considered. Electronically Signed   By: Franchot Gallo M.D.   On: 05/30/2018 07:26    Labs:  CBC: No results for input(s): WBC, HGB, HCT, PLT in the last 8760 hours.  COAGS: Recent Labs    06/12/18 1125  INR 1.00  APTT 27    BMP: No results for input(s): NA, K, CL, CO2, GLUCOSE, BUN, CALCIUM, CREATININE, GFRNONAA, GFRAA in the last 8760 hours.  Invalid input(s): CMP  LIVER FUNCTION TESTS: No results for input(s): BILITOT, AST, ALT, ALKPHOS, PROT, ALBUMIN in the last 8760 hours.  TUMOR MARKERS: No results for input(s): AFPTM, CEA, CA199, CHROMGRNA in the last 8760 hours.  Assessment and Plan:  Persistent right submandibular node enlargement 6-8 months after sinus infection +smokeless tobacco Now scheduled for biopsy Risks and benefits discussed with the patient including, but not limited to bleeding, infection, damage to adjacent structures or low yield requiring additional tests.  All of the patient's questions were answered, patient is agreeable to proceed. Consent signed and in chart.  Thank you for this interesting consult.  I greatly enjoyed meeting  Taequan Stockhausen and look forward to participating in their care.  A copy of this report was sent to the requesting provider on this date.  Electronically Signed: Lavonia Drafts, PA-C 06/12/2018, 12:08 PM   I spent a total of  30 Minutes   in face to face in clinical consultation, greater than 50% of which was counseling/coordinating care for right submandibular LN bx

## 2018-06-18 ENCOUNTER — Telehealth: Payer: Self-pay

## 2018-06-18 NOTE — Telephone Encounter (Signed)
Called pt and informed him that the biopsy of his right submandibular lymph node was negative for malignancy.  Pt expressed understanding.  Charyl Bigger, CMA

## 2018-09-19 DIAGNOSIS — I10 Essential (primary) hypertension: Secondary | ICD-10-CM | POA: Diagnosis not present

## 2018-09-19 DIAGNOSIS — Z23 Encounter for immunization: Secondary | ICD-10-CM | POA: Diagnosis not present

## 2019-09-12 ENCOUNTER — Other Ambulatory Visit: Payer: 59

## 2019-10-07 IMAGING — US US SOFT TISSUE HEAD/NECK
1 series · 14 of 18 positions shown · non-contrast
Comparison: None.

CLINICAL DATA: Right neck adenopathy

EXAM:
ULTRASOUND OF HEAD/NECK SOFT TISSUES
TECHNIQUE: Ultrasound examination of the head and neck soft tissues was
performed in the area of clinical concern.

[Series 1: us soft tissue head/neck · 0.07mm/px · 14 of 18 slices shown]
[im 1/18]
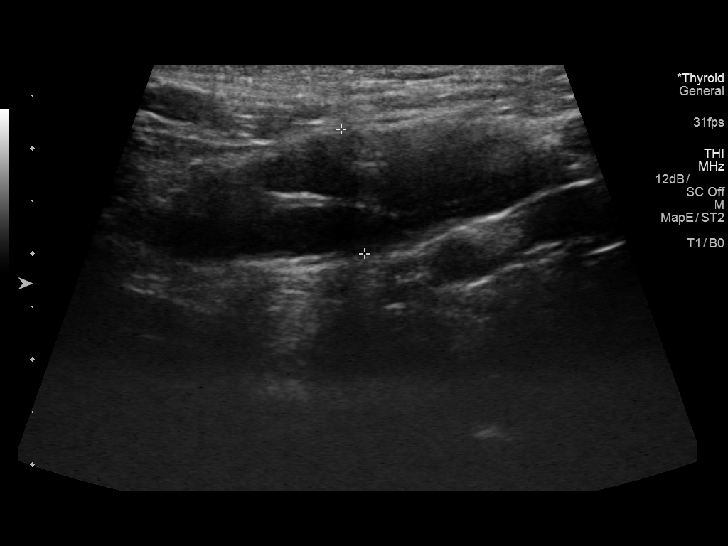
[im 2/18]
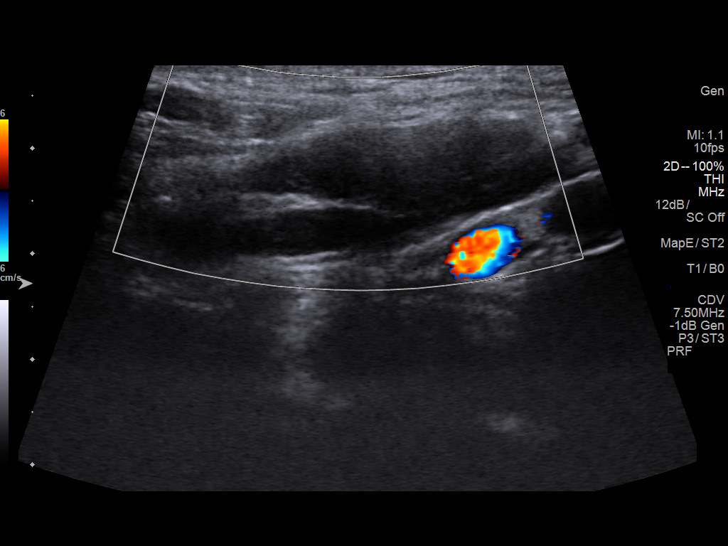
[im 4/18]
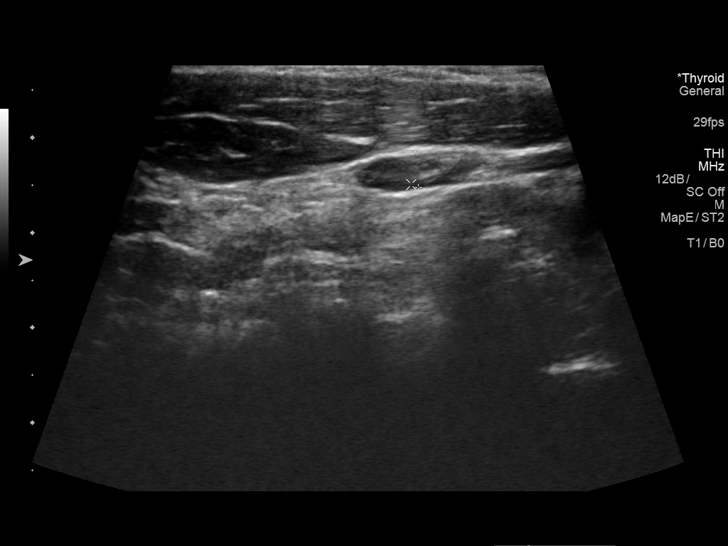
[im 5/18]
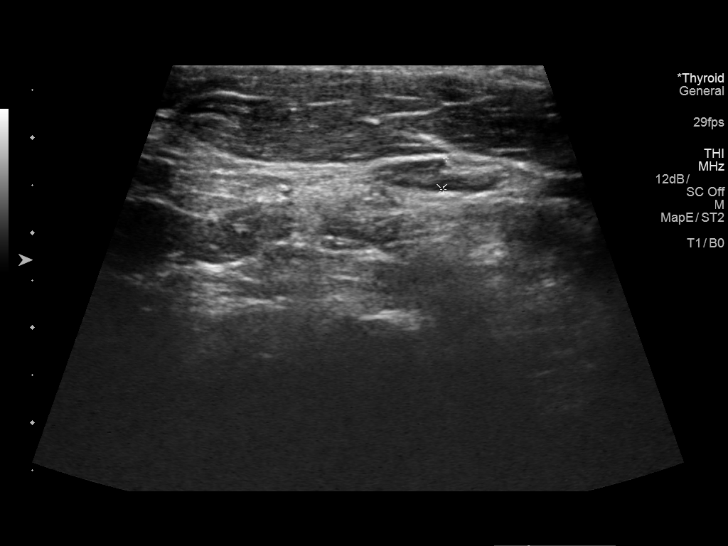
[im 6/18]
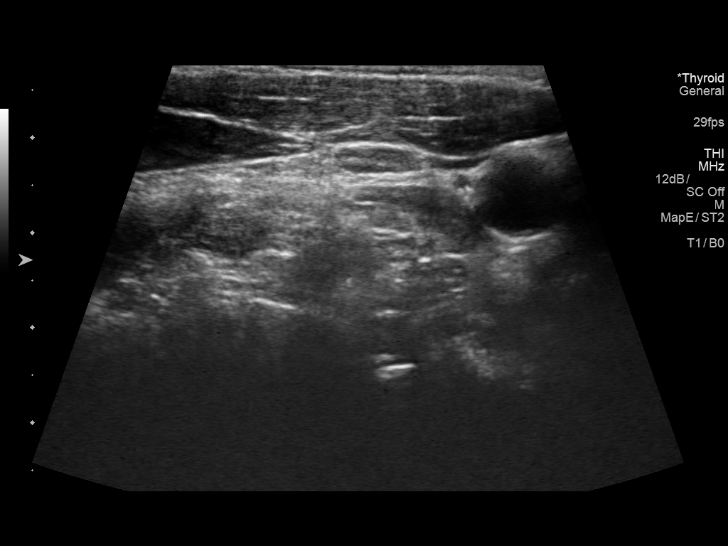
[im 8/18]
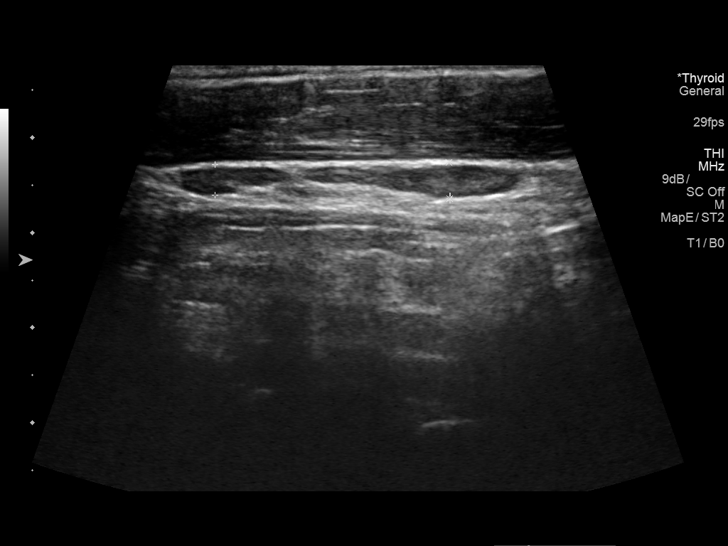
[im 9/18]
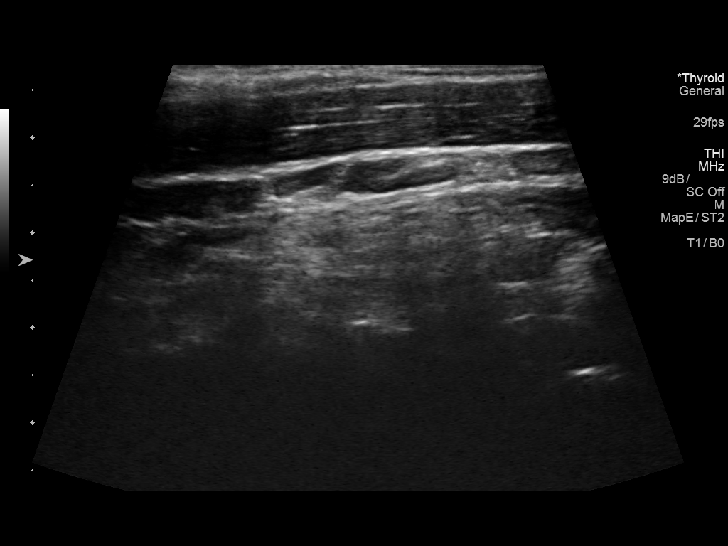
[im 10/18]
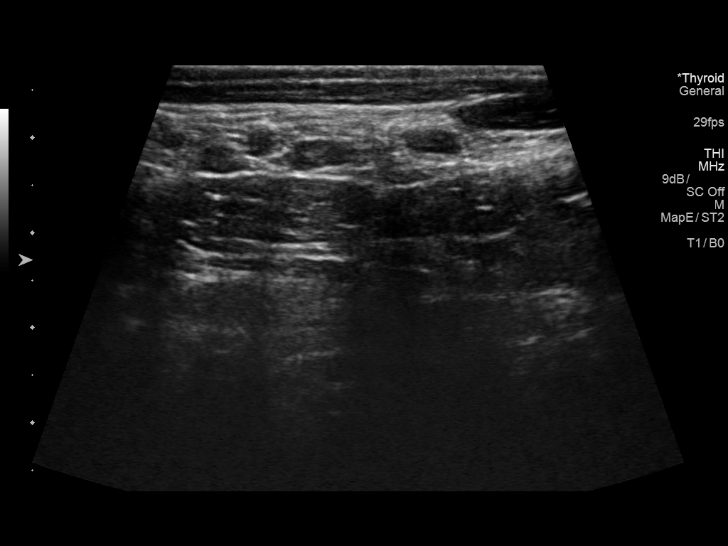
[im 11/18]
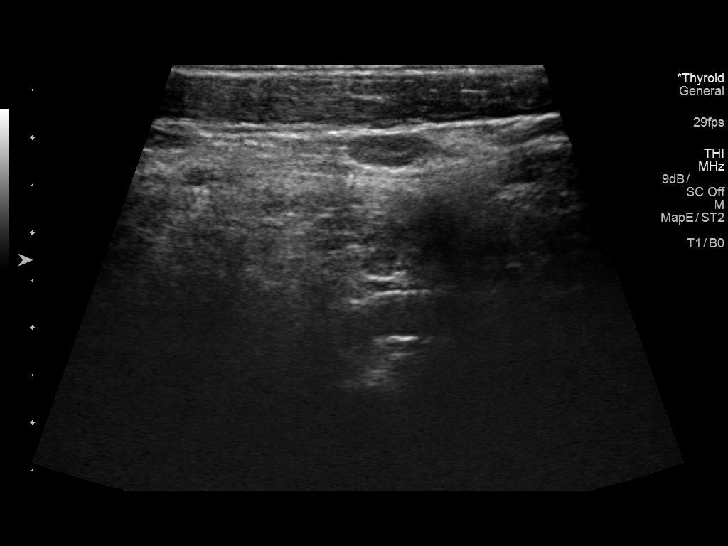
[im 13/18]
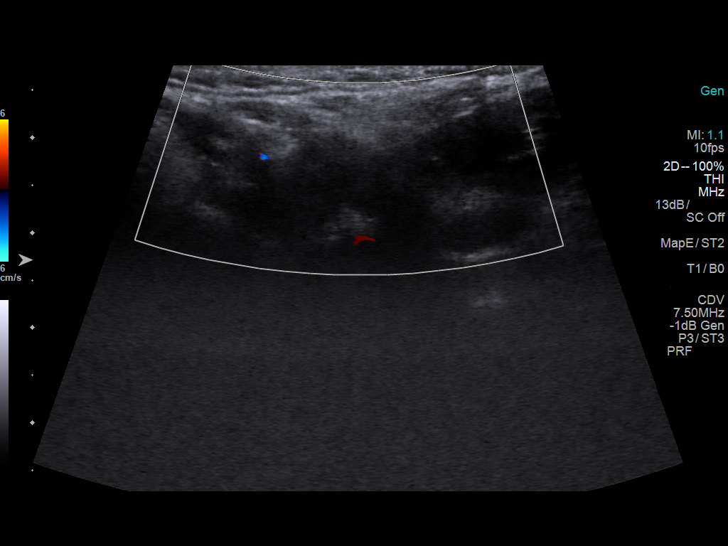
[im 14/18]
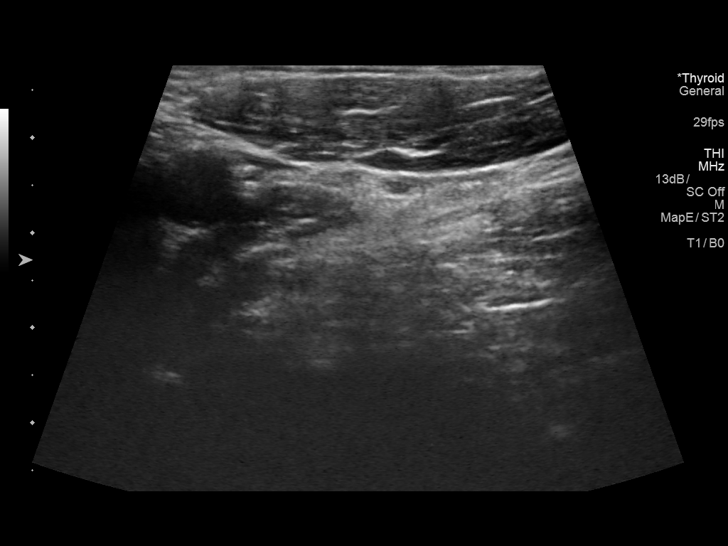
[im 15/18]
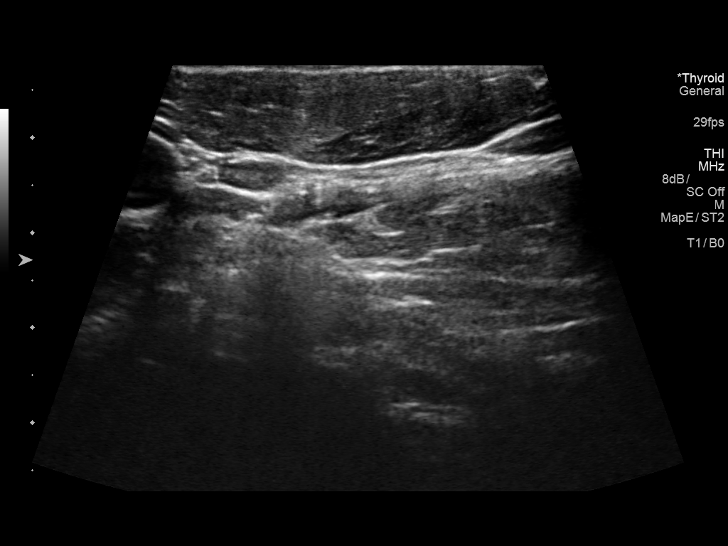
[im 17/18]
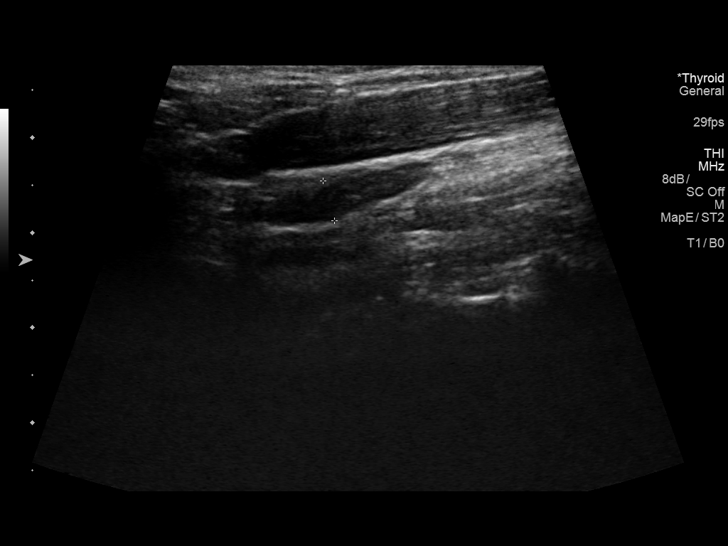
[im 18/18]
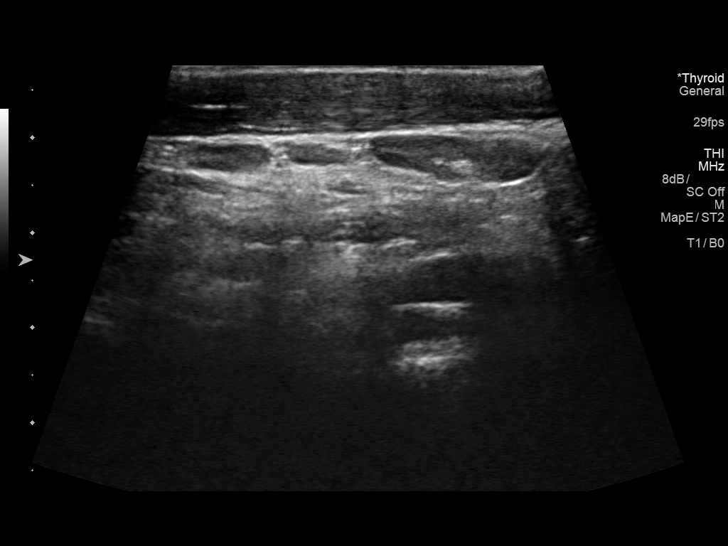

[14 of 18 positions shown; findings below may reference images not displayed]

FINDINGS: Palpable abnormality in the right submandibular region was scanned.
This corresponds to a hypoechoic lymph node measuring 1.2 cm in
short axis dimension. Additional smaller normal nodes are present in
the right neck.
IMPRESSION: 1.2 cm submandibular lymph node on the right. Ultrasound appearance
is nonspecific but this may be a benign reactive node. If this shows
interval growth, biopsy should be considered.

## 2019-10-21 IMAGING — US US BIOPSY LYMPH NODE
1 series · 11 of 11 positions shown · non-contrast
Comparison: none

INDICATION: 31-year-old male with a history of right submandibular lymph node
enlargement. No malignancy history. He presents for FNA lymph node

[Series 1: us biopsy lymph node · 0.05mm/px · 11 acquisitions, 11 frames shown]
[im 1/11]
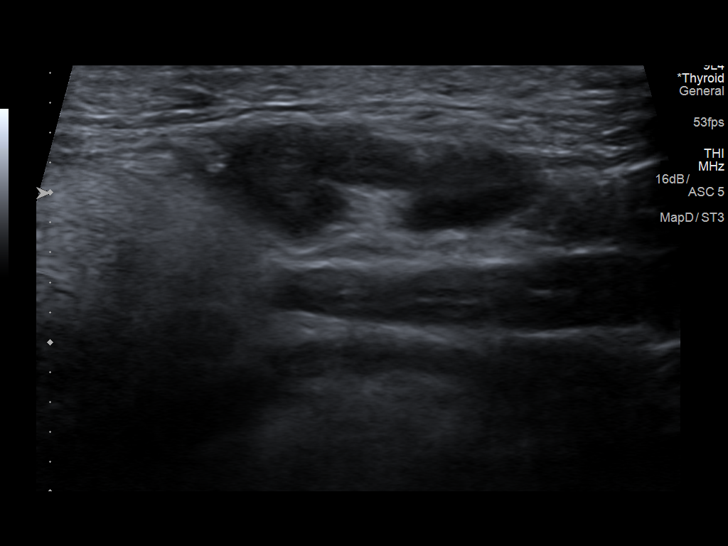
[im 2/11]
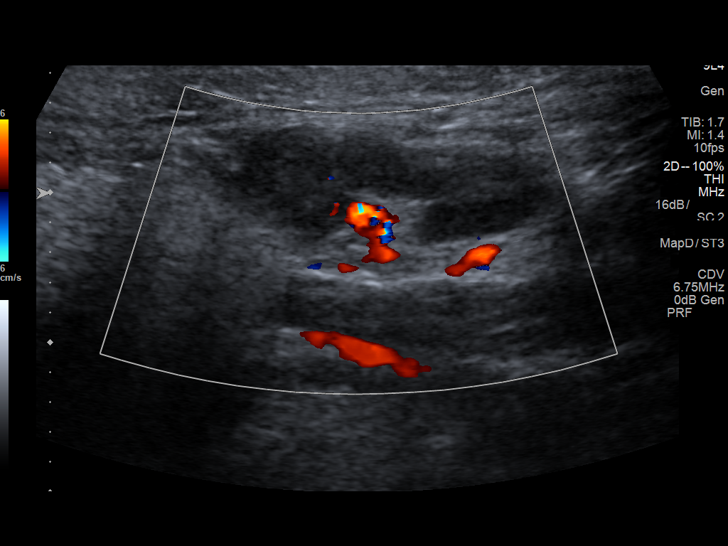
[im 3/11]
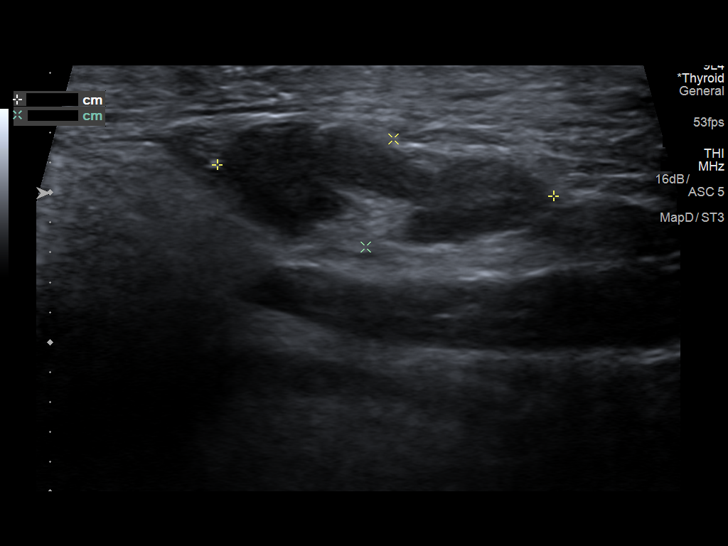
[im 4/11]
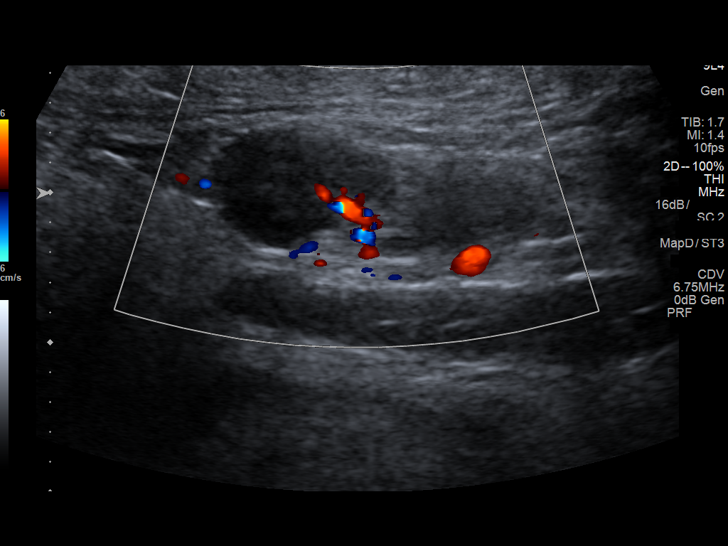
[im 5/11]
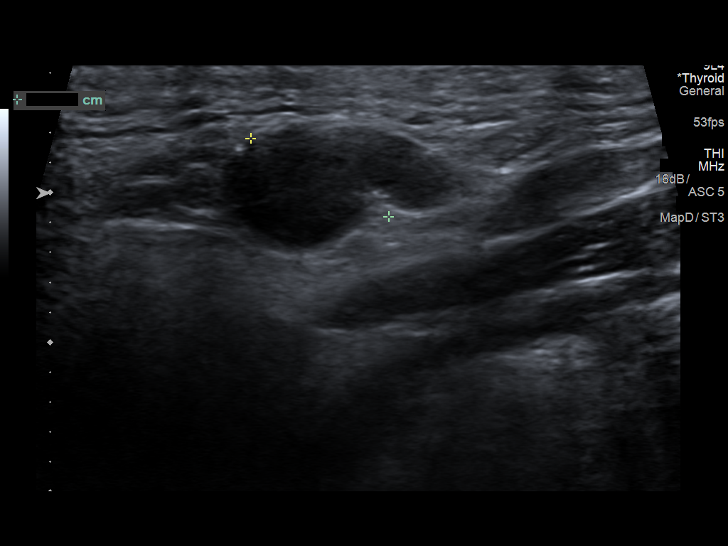
[im 6/11]
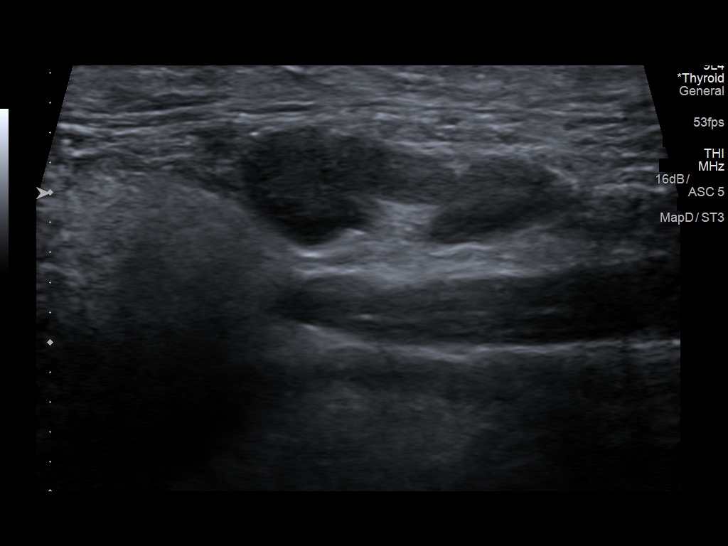
[im 7/11]
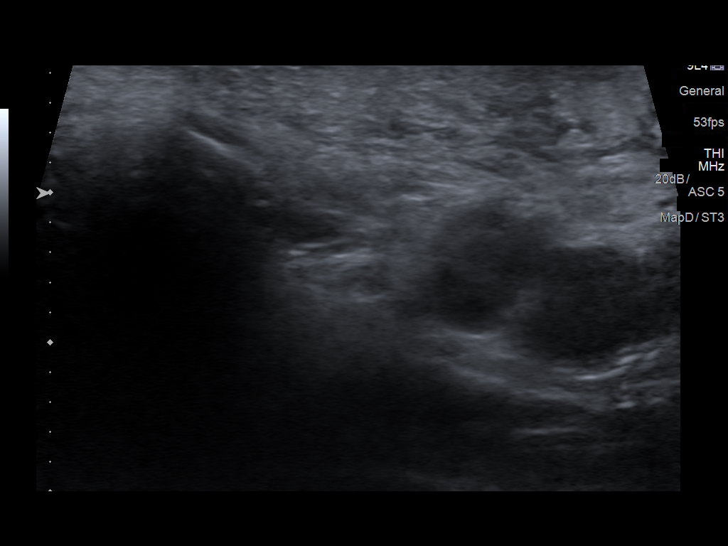
[im 8/11]
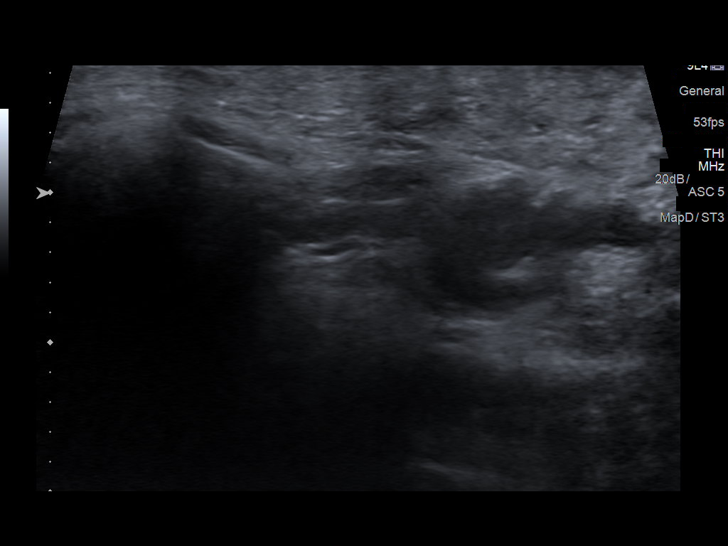
[im 9/11]
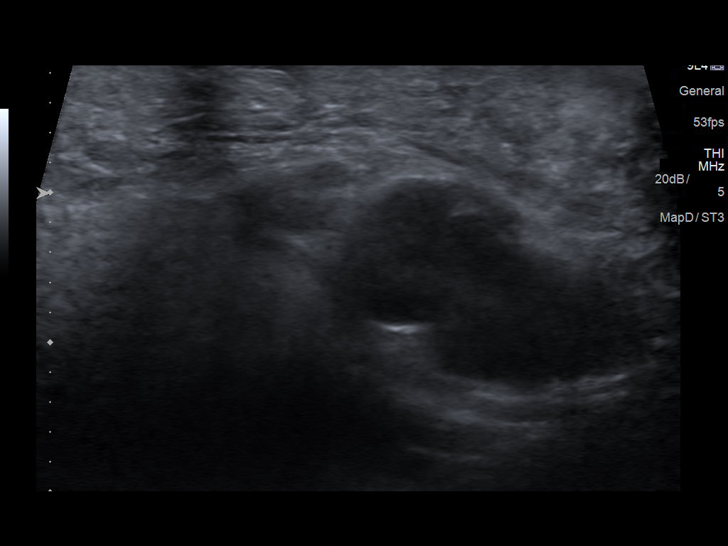
[im 10/11]
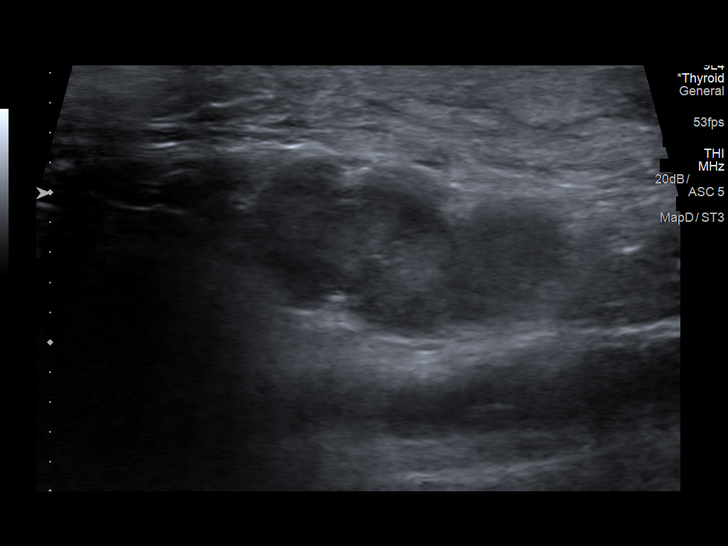
[im 11/11]
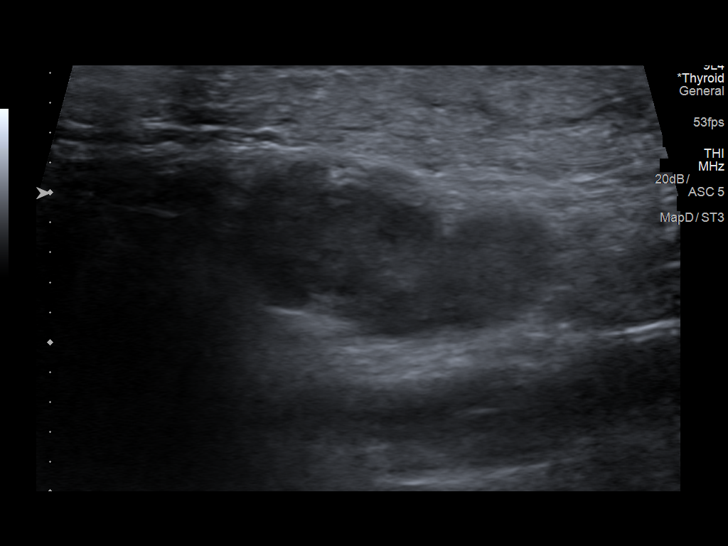

[11 of 11 positions shown; findings below may reference images not displayed]

EXAM:
ULTRASOUND GUIDED FNA RIGHT SUBMANDIBULAR LYMPH NODE

MEDICATIONS:
None.

ANESTHESIA/SEDATION:
Moderate (conscious) sedation was employed during this procedure. A
total of Versed 1.0 mg and Fentanyl 50 mcg was administered
intravenously.

Moderate Sedation Time: 26 minutes. The patient's level of
consciousness and vital signs were monitored continuously by
radiology nursing throughout the procedure under my direct
supervision.

FLUOROSCOPY TIME:  Ultrasound

COMPLICATIONS:
None

PROCEDURE:
Informed written consent was obtained from the patient after a
thorough discussion of the procedural risks, benefits and
alternatives. All questions were addressed. Maximal Sterile Barrier
Technique was utilized including caps, mask, sterile gowns, sterile
gloves, sterile drape, hand hygiene and skin antiseptic. A timeout
was performed prior to the initiation of the procedure.

Patient positioned supine position on the ultrasound gantry. Images
of the right submandibular region stored sent to PACs.

The patient is prepped and draped in the usual sterile fashion. 1%
lidocaine was used for local anesthesia.

Multiple FNA were then performed. Samples were passed to
cytotechnologist in the room.

After slide preparation, final image was stored and a sterile
bandage was placed.

Patient tolerated the procedure well and remained hemodynamically
stable throughout.

No complications were encountered and no significant blood loss.
IMPRESSION: Status post ultrasound guided FNA of right submandibular lymph node.

## 2021-03-16 ENCOUNTER — Other Ambulatory Visit: Payer: Self-pay

## 2021-03-16 ENCOUNTER — Ambulatory Visit (INDEPENDENT_AMBULATORY_CARE_PROVIDER_SITE_OTHER): Payer: 59 | Admitting: Nurse Practitioner

## 2021-03-16 ENCOUNTER — Encounter: Payer: Self-pay | Admitting: Nurse Practitioner

## 2021-03-16 VITALS — BP 137/83 | HR 97 | Temp 98.5°F | Ht 75.0 in | Wt 214.7 lb

## 2021-03-16 DIAGNOSIS — Z6826 Body mass index (BMI) 26.0-26.9, adult: Secondary | ICD-10-CM

## 2021-03-16 DIAGNOSIS — Z7689 Persons encountering health services in other specified circumstances: Secondary | ICD-10-CM | POA: Diagnosis not present

## 2021-03-16 DIAGNOSIS — I10 Essential (primary) hypertension: Secondary | ICD-10-CM | POA: Diagnosis not present

## 2021-03-16 MED ORDER — LOSARTAN POTASSIUM 50 MG PO TABS: 50.0000 mg | ORAL_TABLET | Freq: Every day | ORAL | 1 refills | Status: DC

## 2021-03-16 NOTE — Progress Notes (Signed)
New Patient Office Visit  Subjective:  Patient ID: Jerry Dudley, male    DOB: [birth year]  Age: 35 y.o. MRN: [id number]  CC:  Chief Complaint  Patient presents with   New Patient (Initial Visit)    HPI Jerry Dudley presents to establish new primary care provider.  States he has medical history of high blood pressure.  Has been out of medications for about 2 weeks.  With taking losartan 50 mg daily.  States that starting this Sunday, started feeling weak and lightheaded.  Denies chest pain or chest pressure.  States he did have a mild headache on Saturday.  States he also [place], potentially not getting enough to drink.  States that since the week started, he has had no further symptoms. Patient does need to have routine, fasting labs.  He should have an annual wellness visit.  Past Medical History:  Diagnosis Date   Hypertension     History reviewed. No pertinent surgical history.  Family History  Problem Relation Age of Onset   Healthy Mother    Healthy Father    High blood pressure Father    Healthy Sister    Healthy Daughter     Social History   Socioeconomic History   Marital status: Married    Spouse name: Not on file   Number of children: Not on file   Years of education: Not on file   Highest education level: Not on file  Occupational History   Not on file  Tobacco Use   Smoking status: Never   Smokeless tobacco: Current    Types: Snuff  Vaping Use   Vaping Use: Never used  Substance and Sexual Activity   Alcohol use: Yes    Alcohol/week: 2.0 standard drinks    Types: 2 Cans of beer per week   Drug use: No   Sexual activity: Yes    Birth control/protection: Inserts  Other Topics Concern   Not on file  Social History Narrative   Not on file   Social Determinants of Health   Financial Resource Strain: Not on file  Food Insecurity: Not on file  Transportation Needs: Not on file  Physical Activity: Not on  file  Stress: Not on file  Social Connections: Not on file  Intimate Partner Violence: Not on file    ROS Review of Systems  Constitutional:  Negative for activity change, chills, fatigue and fever.  HENT:  Negative for congestion, postnasal drip, rhinorrhea, sinus pressure and sinus pain.   Eyes: Negative.   Respiratory:  Negative for cough, chest tightness and wheezing.   Cardiovascular:  Negative for chest pain and palpitations.       Blood pressure within normal limits despite not being on blood pressure medication for 2 weeks.  Gastrointestinal:  Negative for constipation, diarrhea, nausea and vomiting.  Endocrine: Negative for cold intolerance, heat intolerance, polydipsia and polyuria.  Musculoskeletal:  Negative for arthralgias, back pain and myalgias.  Skin:  Negative for rash.  Allergic/Immunologic: Negative.   Neurological:  Positive for dizziness and headaches. Negative for weakness.       Single episode of dizziness, lightheadedness, weakness this past Monday.  Has not had further symptoms since then.  Psychiatric/Behavioral:  The patient is nervous/anxious.    Objective:   Today's Vitals   03/16/21 1339  BP: 137/83  Pulse: 97  Temp: 98.5 F (36.9 C)  SpO2: 99%  Weight: 214 lb 11.2 oz (97.4 kg)  Height: 6'  3" (1.905 m)   Body mass index is 26.84 kg/m.   Physical Exam Vitals and nursing note reviewed.  Constitutional:      Appearance: Normal appearance. He is well-developed.  HENT:     Head: Normocephalic and atraumatic.     Right Ear: Ear canal and external ear normal.     Left Ear: Ear canal and external ear normal.     Nose: Nose normal.     Mouth/Throat:     Mouth: Mucous membranes are moist.     Pharynx: Oropharynx is clear.  Eyes:     Extraocular Movements: Extraocular movements intact.     Conjunctiva/sclera: Conjunctivae normal.     Pupils: Pupils are equal, round, and reactive to light.  Cardiovascular:     Rate and Rhythm: Normal rate and  regular rhythm.     Pulses: Normal pulses.     Heart sounds: Normal heart sounds.  Pulmonary:     Effort: Pulmonary effort is normal.     Breath sounds: Normal breath sounds.  Abdominal:     Palpations: Abdomen is soft.  Musculoskeletal:        General: Normal range of motion.     Cervical back: Normal range of motion and neck supple.  Lymphadenopathy:     Cervical: No cervical adenopathy.  Skin:    General: Skin is warm and dry.     Capillary Refill: Capillary refill takes less than 2 seconds.  Neurological:     General: No focal deficit present.     Mental Status: He is alert and oriented to person, place, and time.     Cranial Nerves: No cranial nerve deficit.     Sensory: No sensory deficit.     Motor: No weakness.     Coordination: Coordination normal.     Gait: Gait normal.  Psychiatric:        Mood and Affect: Mood normal.        Behavior: Behavior normal.        Thought Content: Thought content normal.        Judgment: Judgment normal.    Assessment & Plan:  1. Encounter to establish care Appointment today to establish new primary care provider.  2. Essential hypertension Restart losartan 50 mg tablets daily.  Encouraged him to limit salt and increase water in his diet.  Continue with regular physical activity.  3. Body mass index 26.0-26.9, adult Discussed that low-fat, low-cholesterol diet and regular exercise, will help him to lose weight and control blood pressure, potentially without medication in the future.  Will monitor.  Problem List Items Addressed This Visit       Cardiovascular and Mediastinum   Essential hypertension   Relevant Medications   losartan (COZAAR) 50 MG tablet     Other   Encounter to establish care - Primary   Body mass index 26.0-26.9, adult    This note was dictated using Dragon Voice Recognition Software. Rapid proofreading was performed to expedite the delivery of the information. Despite proofreading, phonetic errors will  occur which are common with this voice recognition software. Please take this into consideration. If there are any concerns, please contact our office.   Follow-up: Return in about 2 weeks (around 03/30/2021) for health maintenance exam - FBW with Free T4 and HgbA1c in next few days .   Jerry Dudley Freshwater, NP

## 2021-03-17 ENCOUNTER — Other Ambulatory Visit: Payer: 59

## 2021-03-17 DIAGNOSIS — Z0000 Encounter for general adult medical examination without abnormal findings: Secondary | ICD-10-CM

## 2021-03-17 DIAGNOSIS — R42 Dizziness and giddiness: Secondary | ICD-10-CM

## 2021-03-17 DIAGNOSIS — R5383 Other fatigue: Secondary | ICD-10-CM

## 2021-03-18 LAB — HEMOGLOBIN A1C
Est. average glucose Bld gHb Est-mCnc: 120 mg/dL
Hgb A1c MFr Bld: 5.8 % — ABNORMAL HIGH (ref 4.8–5.6)

## 2021-03-18 LAB — COMPREHENSIVE METABOLIC PANEL
ALT: 16 IU/L (ref 0–44)
AST: 12 IU/L (ref 0–40)
Albumin/Globulin Ratio: 2.2 (ref 1.2–2.2)
Albumin: 5 g/dL (ref 4.0–5.0)
Alkaline Phosphatase: 54 IU/L (ref 44–121)
BUN/Creatinine Ratio: 16 (ref 9–20)
BUN: 14 mg/dL (ref 6–20)
Bilirubin Total: 0.4 mg/dL (ref 0.0–1.2)
CO2: 22 mmol/L (ref 20–29)
Calcium: 10.3 mg/dL — ABNORMAL HIGH (ref 8.7–10.2)
Chloride: 102 mmol/L (ref 96–106)
Creatinine, Ser: 0.88 mg/dL (ref 0.76–1.27)
Globulin, Total: 2.3 g/dL (ref 1.5–4.5)
Glucose: 101 mg/dL — ABNORMAL HIGH (ref 65–99)
Potassium: 4.8 mmol/L (ref 3.5–5.2)
Sodium: 141 mmol/L (ref 134–144)
Total Protein: 7.3 g/dL (ref 6.0–8.5)
eGFR: 116 mL/min/{1.73_m2} (ref >59)

## 2021-03-18 LAB — LIPID PANEL
Chol/HDL Ratio: 5.6 ratio — ABNORMAL HIGH (ref 0.0–5.0)
Cholesterol, Total: 275 mg/dL — ABNORMAL HIGH (ref 100–199)
HDL: 49 mg/dL (ref >39)
LDL Chol Calc (NIH): 201 mg/dL — ABNORMAL HIGH (ref 0–99)
Triglycerides: 138 mg/dL (ref 0–149)
VLDL Cholesterol Cal: 25 mg/dL (ref 5–40)

## 2021-03-18 LAB — TSH: TSH: 1.98 u[IU]/mL (ref 0.450–4.500)

## 2021-03-18 LAB — CBC WITH DIFFERENTIAL/PLATELET
Basophils Absolute: 0 10*3/uL (ref 0.0–0.2)
Basos: 1 %
EOS (ABSOLUTE): 0.1 10*3/uL (ref 0.0–0.4)
Eos: 2 %
Hematocrit: 44.8 % (ref 37.5–51.0)
Hemoglobin: 14.9 g/dL (ref 13.0–17.7)
Immature Grans (Abs): 0 10*3/uL (ref 0.0–0.1)
Immature Granulocytes: 0 %
Lymphocytes Absolute: 2.5 10*3/uL (ref 0.7–3.1)
Lymphs: 44 %
MCH: 29 pg (ref 26.6–33.0)
MCHC: 33.3 g/dL (ref 31.5–35.7)
MCV: 87 fL (ref 79–97)
Monocytes Absolute: 0.4 10*3/uL (ref 0.1–0.9)
Monocytes: 6 %
Neutrophils Absolute: 2.7 10*3/uL (ref 1.4–7.0)
Neutrophils: 47 %
Platelets: 345 10*3/uL (ref 150–450)
RBC: 5.13 x10E6/uL (ref 4.14–5.80)
RDW: 12.9 % (ref 11.6–15.4)
WBC: 5.7 10*3/uL (ref 3.4–10.8)

## 2021-03-18 LAB — T4, FREE: Free T4: 1.24 ng/dL (ref 0.82–1.77)

## 2021-03-27 DIAGNOSIS — Z7689 Persons encountering health services in other specified circumstances: Secondary | ICD-10-CM | POA: Insufficient documentation

## 2021-03-27 DIAGNOSIS — Z6826 Body mass index (BMI) 26.0-26.9, adult: Secondary | ICD-10-CM | POA: Insufficient documentation

## 2021-03-27 NOTE — Progress Notes (Signed)
Mild elevation of lipids and calcium. Recheck calcium along with PTH, magnesium, and phosphorus. Discuss with patient at visit 03/30/2021.

## 2021-03-27 NOTE — Patient Instructions (Signed)

## 2021-03-30 ENCOUNTER — Ambulatory Visit: Payer: 59 | Admitting: Nurse Practitioner

## 2021-04-14 ENCOUNTER — Ambulatory Visit: Payer: 59 | Admitting: Nurse Practitioner

## 2021-04-14 ENCOUNTER — Encounter: Payer: Self-pay | Admitting: Nurse Practitioner

## 2021-04-14 ENCOUNTER — Other Ambulatory Visit: Payer: Self-pay

## 2021-04-14 VITALS — BP 137/98 | HR 99 | Temp 97.8°F | Ht 75.0 in | Wt 217.0 lb

## 2021-04-14 DIAGNOSIS — E785 Hyperlipidemia, unspecified: Secondary | ICD-10-CM

## 2021-04-14 DIAGNOSIS — R0683 Snoring: Secondary | ICD-10-CM

## 2021-04-14 DIAGNOSIS — Z0110 Encounter for examination of ears and hearing without abnormal findings: Secondary | ICD-10-CM | POA: Insufficient documentation

## 2021-04-14 DIAGNOSIS — R030 Elevated blood-pressure reading, without diagnosis of hypertension: Secondary | ICD-10-CM | POA: Insufficient documentation

## 2021-04-14 DIAGNOSIS — R5383 Other fatigue: Secondary | ICD-10-CM

## 2021-04-14 DIAGNOSIS — E7800 Pure hypercholesterolemia, unspecified: Secondary | ICD-10-CM | POA: Insufficient documentation

## 2021-04-14 DIAGNOSIS — F4310 Post-traumatic stress disorder, unspecified: Secondary | ICD-10-CM | POA: Insufficient documentation

## 2021-04-14 DIAGNOSIS — I10 Essential (primary) hypertension: Secondary | ICD-10-CM

## 2021-04-14 MED ORDER — ROSUVASTATIN CALCIUM 5 MG PO TABS: 2.5000 mg | ORAL_TABLET | Freq: Every day | ORAL | 1 refills | Status: DC

## 2021-04-14 NOTE — Progress Notes (Signed)
Established Patient Office Visit  Subjective:  Patient ID: Jerry Dudley, male    DOB: [birth year]  Age: 35 y.o. MRN: [id number]  CC:  Chief Complaint  Patient presents with   Hypertension    HPI Jerry Dudley presents for follow-up visit.  Was started on losartan at his last visit.  The blood pressure is mildly elevated today it is much improved from recent check.  He has tolerated the medication well.  Denies chest pain chest pressure, headache, or shortness of breath.  He did have routine, fasting labs drawn prior to this visit.  He did have an elevation of his LDL and total cholesterol.  He is at increased risk for cardiovascular disease.  Lipid Panel     Component Value Date/Time   CHOL 275 (H) 03/17/2021 0910   TRIG 138 03/17/2021 0910   HDL 49 03/17/2021 0910   CHOLHDL 5.6 (H) 03/17/2021 0910   LDLCALC 201 (H) 03/17/2021 0910   LABVLDL 25 03/17/2021 0910  His calcium level was also mildly elevated at 10.3.  His other laboratories are normal. He states that he feels tired all the time.  His wife has told him that he snores very loudly and gasps for breath.  She says he wakes up frequently during the night to catch his breath.  States that in the past he did take a home sleep test which was administered by the [facility] hospital.  Was told he had mild sleep apnea but did not qualify for further treatment at that time. Patient denies other physical concerns or complaints today. He denies chest pain, chest pressure, or shortness of breath. He denies headaches or visual disturbances. He denies abdominal pain, nausea, vomiting, or changes in bowel or bladder habits.    Past Medical History:  Diagnosis Date   Hypertension     History reviewed. No pertinent surgical history.  Family History  Problem Relation Age of Onset   Healthy Mother    Healthy Father    High blood pressure Father    Healthy Sister    Healthy Daughter     Social History   Socioeconomic History   Marital  status: Married    Spouse name: Not on file   Number of children: Not on file   Years of education: Not on file   Highest education level: Not on file  Occupational History   Not on file  Tobacco Use   Smoking status: Never   Smokeless tobacco: Current    Types: Snuff  Vaping Use   Vaping Use: Never used  Substance and Sexual Activity   Alcohol use: Yes    Alcohol/week: 2.0 standard drinks    Types: 2 Cans of beer per week   Drug use: No   Sexual activity: Yes    Birth control/protection: Inserts  Other Topics Concern   Not on file  Social History Narrative   Not on file   Social Determinants of Health   Financial Resource Strain: Not on file  Food Insecurity: Not on file  Transportation Needs: Not on file  Physical Activity: Not on file  Stress: Not on file  Social Connections: Not on file  Intimate Partner Violence: Not on file    Outpatient Medications Prior to Visit  Medication Sig Dispense Refill   losartan (COZAAR) 50 MG tablet Take 1 tablet (50 mg total) by mouth daily. 90 tablet 1   Polyethyl Glycol-Propyl Glycol 0.4-0.3 % SOLN Place 1 drop into both eyes 3 (three) times daily  as needed (for dry/irritated eyes.).     No facility-administered medications prior to visit.    No Known Allergies  ROS Review of Systems  Constitutional:  Positive for fatigue. Negative for activity change, chills and fever.  HENT:  Negative for congestion, postnasal drip, rhinorrhea, sinus pressure, sinus pain, sneezing and sore throat.   Eyes: Negative.   Respiratory:  Negative for cough, shortness of breath and wheezing.   Cardiovascular:  Negative for chest pain and palpitations.  Gastrointestinal:  Negative for constipation, diarrhea, nausea and vomiting.  Endocrine: Negative for cold intolerance, heat intolerance, polydipsia and polyuria.  Genitourinary:  Negative for dysuria, frequency and urgency.  Musculoskeletal:  Negative for back pain and myalgias.  Skin:  Negative  for rash.  Allergic/Immunologic: Negative for environmental allergies.  Neurological:  Negative for dizziness, weakness and headaches.  Psychiatric/Behavioral:  The patient is not nervous/anxious.      Objective:    Physical Exam Vitals and nursing note reviewed.  Constitutional:      Appearance: Normal appearance. He is well-developed.  HENT:     Head: Normocephalic and atraumatic.     Nose: Nose normal.     Mouth/Throat:     Mouth: Mucous membranes are moist.  Eyes:     Extraocular Movements: Extraocular movements intact.     Conjunctiva/sclera: Conjunctivae normal.     Pupils: Pupils are equal, round, and reactive to light.  Cardiovascular:     Rate and Rhythm: Normal rate and regular rhythm.     Pulses: Normal pulses.     Heart sounds: Normal heart sounds.  Pulmonary:     Effort: Pulmonary effort is normal.     Breath sounds: Normal breath sounds.  Abdominal:     Palpations: Abdomen is soft.  Musculoskeletal:        General: Normal range of motion.     Cervical back: Normal range of motion and neck supple.  Lymphadenopathy:     Cervical: No cervical adenopathy.  Skin:    General: Skin is warm and dry.     Capillary Refill: Capillary refill takes less than 2 seconds.  Neurological:     General: No focal deficit present.     Mental Status: He is alert and oriented to person, place, and time.  Psychiatric:        Mood and Affect: Mood normal.        Behavior: Behavior normal.        Thought Content: Thought content normal.        Judgment: Judgment normal.    Today's Vitals   04/14/21 1549  BP: (!) 137/98  Pulse: 99  Temp: 97.8 F (36.6 C)  SpO2: 99%  Weight: 217 lb (98.4 kg)  Height: _0  (1.905 m)   Body mass index is 27.12 kg/m.   Wt Readings from Last 3 Encounters:  04/14/21 217 lb (98.4 kg)  03/16/21 214 lb 11.2 oz (97.4 kg)  06/12/18 215 lb (97.5 kg)     Health Maintenance Due  Topic Date Due   COVID-19 Vaccine (1) Never done    Pneumococcal Vaccine 71-56 Years old (1 - PCV) Never done   Hepatitis C Screening  Never done   TETANUS/TDAP  10/30/2019   INFLUENZA VACCINE  03/21/2021    There are no preventive care reminders to display for this patient.  Lab Results  Component Value Date   TSH 1.980 03/17/2021   Lab Results  Component Value Date   WBC 5.7 03/17/2021  HGB 14.9 03/17/2021   HCT 44.8 03/17/2021   MCV 87 03/17/2021   PLT 345 03/17/2021   Lab Results  Component Value Date   NA 141 03/17/2021   K 4.8 03/17/2021   CO2 22 03/17/2021   GLUCOSE 101 (H) 03/17/2021   BUN 14 03/17/2021   CREATININE 0.88 03/17/2021   BILITOT 0.4 03/17/2021   ALKPHOS 54 03/17/2021   AST 12 03/17/2021   ALT 16 03/17/2021   PROT 7.3 03/17/2021   ALBUMIN 5.0 03/17/2021   CALCIUM 10.3 (H) 03/17/2021   EGFR 116 03/17/2021   Lab Results  Component Value Date   CHOL 275 (H) 03/17/2021   Lab Results  Component Value Date   HDL 49 03/17/2021   Lab Results  Component Value Date   LDLCALC 201 (H) 03/17/2021   Lab Results  Component Value Date   TRIG 138 03/17/2021   Lab Results  Component Value Date   CHOLHDL 5.6 (H) 03/17/2021   Lab Results  Component Value Date   HGBA1C 5.8 (H) 03/17/2021      Assessment & Plan:  1. Hyperlipidemia LDL goal <100 Reviewed labs with patient today.  LDL and total cholesterol both elevated.  We will start Crestor 5 mg tablets, taking 1/2 tablet by mouth daily with supper.  Recommend he had co-Q10 to Crestor every evening.  We will recheck fasting lipid panel in 3 months. - rosuvastatin (CRESTOR) 5 MG tablet; Take 0.5 tablets (2.5 mg total) by mouth daily.  Dispense: 90 tablet; Refill: 1  2. Essential hypertension Improved.  Continue losartan as prescribed.  3. Snoring Refer to sleep disorders clinic for further evaluation and possible treatment. - Ambulatory referral to Sleep Studies  4. Other fatigue Possibly due to snoring and potential obstructive sleep apnea.   Patient has been referred to sleep disorder center for further evaluation and treatment. - Ambulatory referral to Sleep Studies   Problem List Items Addressed This Visit       Cardiovascular and Mediastinum   Essential hypertension   Relevant Medications   rosuvastatin (CRESTOR) 5 MG tablet     Other   Hyperlipidemia LDL goal <100 - Primary   Relevant Medications   rosuvastatin (CRESTOR) 5 MG tablet   Snoring   Relevant Orders   Ambulatory referral to Sleep Studies   Other fatigue   Relevant Orders   Ambulatory referral to Sleep Studies    Meds ordered this encounter  Medications   rosuvastatin (CRESTOR) 5 MG tablet    Sig: Take 0.5 tablets (2.5 mg total) by mouth daily.    Dispense:  90 tablet    Refill:  1    Order Specific Question:   Supervising Provider    Answer:   Beatrice Lecher D [[id number]]   This note was dictated using Dragon Voice Recognition Software. Rapid proofreading was performed to expedite the delivery of the information. Despite proofreading, phonetic errors will occur which are common with this voice recognition software. Please take this into consideration. If there are any concerns, please contact our office.    Follow-up: Return in about 3 months (around 07/15/2021) for high cholesterol - check fasting lipids, itnact PTH, HgbA1c, and CMP one week prior to visit.    [name] Freshwater, NP

## 2021-04-14 NOTE — Patient Instructions (Signed)

## 2021-04-25 DIAGNOSIS — R0683 Snoring: Secondary | ICD-10-CM | POA: Insufficient documentation

## 2021-04-25 DIAGNOSIS — R5383 Other fatigue: Secondary | ICD-10-CM | POA: Insufficient documentation

## 2021-07-01 ENCOUNTER — Other Ambulatory Visit: Payer: Self-pay

## 2021-07-01 DIAGNOSIS — R030 Elevated blood-pressure reading, without diagnosis of hypertension: Secondary | ICD-10-CM

## 2021-07-01 DIAGNOSIS — E215 Disorder of parathyroid gland, unspecified: Secondary | ICD-10-CM

## 2021-07-01 DIAGNOSIS — E785 Hyperlipidemia, unspecified: Secondary | ICD-10-CM

## 2021-07-01 DIAGNOSIS — Z0000 Encounter for general adult medical examination without abnormal findings: Secondary | ICD-10-CM

## 2021-07-04 ENCOUNTER — Other Ambulatory Visit: Payer: 59

## 2021-07-11 ENCOUNTER — Ambulatory Visit: Payer: 59 | Admitting: Nurse Practitioner

## 2023-06-29 ENCOUNTER — Other Ambulatory Visit: Payer: Self-pay

## 2023-06-29 ENCOUNTER — Emergency Department (HOSPITAL_BASED_OUTPATIENT_CLINIC_OR_DEPARTMENT_OTHER): Payer: BLUE CROSS/BLUE SHIELD

## 2023-06-29 ENCOUNTER — Emergency Department (HOSPITAL_BASED_OUTPATIENT_CLINIC_OR_DEPARTMENT_OTHER)
Admission: EM | Admit: 2023-06-29 | Discharge: 2023-06-29 | Disposition: A | Discharge status: Home / Self Care | Payer: BLUE CROSS/BLUE SHIELD | Attending: Emergency Medicine | Admitting: Emergency Medicine

## 2023-06-29 ENCOUNTER — Encounter (HOSPITAL_BASED_OUTPATIENT_CLINIC_OR_DEPARTMENT_OTHER): Payer: Self-pay | Admitting: Emergency Medicine

## 2023-06-29 DIAGNOSIS — R109 Unspecified abdominal pain: Secondary | ICD-10-CM | POA: Insufficient documentation

## 2023-06-29 DIAGNOSIS — N200 Calculus of kidney: Secondary | ICD-10-CM | POA: Diagnosis not present

## 2023-06-29 DIAGNOSIS — N289 Disorder of kidney and ureter, unspecified: Secondary | ICD-10-CM

## 2023-06-29 DIAGNOSIS — R932 Abnormal findings on diagnostic imaging of liver and biliary tract: Secondary | ICD-10-CM | POA: Diagnosis not present

## 2023-06-29 DIAGNOSIS — N281 Cyst of kidney, acquired: Secondary | ICD-10-CM | POA: Diagnosis not present

## 2023-06-29 DIAGNOSIS — R197 Diarrhea, unspecified: Secondary | ICD-10-CM | POA: Insufficient documentation

## 2023-06-29 DIAGNOSIS — R935 Abnormal findings on diagnostic imaging of other abdominal regions, including retroperitoneum: Secondary | ICD-10-CM | POA: Diagnosis not present

## 2023-06-29 DIAGNOSIS — N2889 Other specified disorders of kidney and ureter: Secondary | ICD-10-CM | POA: Diagnosis not present

## 2023-06-29 LAB — CBC WITH DIFFERENTIAL/PLATELET
Abs Immature Granulocytes: 0.02 10*3/uL (ref 0.00–0.07)
Basophils Absolute: 0 10*3/uL (ref 0.0–0.1)
Basophils Relative: 0 %
Eosinophils Absolute: 0.1 10*3/uL (ref 0.0–0.5)
Eosinophils Relative: 1 %
HCT: 43.3 % (ref 39.0–52.0)
Hemoglobin: 14.7 g/dL (ref 13.0–17.0)
Immature Granulocytes: 0 %
Lymphocytes Relative: 31 %
Lymphs Abs: 3.1 10*3/uL (ref 0.7–4.0)
MCH: 29.6 pg (ref 26.0–34.0)
MCHC: 33.9 g/dL (ref 30.0–36.0)
MCV: 87.3 fL (ref 80.0–100.0)
Monocytes Absolute: 0.6 10*3/uL (ref 0.1–1.0)
Monocytes Relative: 6 %
Neutro Abs: 6 10*3/uL (ref 1.7–7.7)
Neutrophils Relative %: 62 %
Platelets: 339 10*3/uL (ref 150–400)
RBC: 4.96 MIL/uL (ref 4.22–5.81)
RDW: 12.9 % (ref 11.5–15.5)
WBC: 9.8 10*3/uL (ref 4.0–10.5)
nRBC: 0 % (ref 0.0–0.2)

## 2023-06-29 LAB — COMPREHENSIVE METABOLIC PANEL
ALT: 34 U/L (ref 0–44)
AST: 23 U/L (ref 15–41)
Albumin: 4.4 g/dL (ref 3.5–5.0)
Alkaline Phosphatase: 42 U/L (ref 38–126)
Anion gap: 12 (ref 5–15)
BUN: 20 mg/dL (ref 6–20)
CO2: 22 mmol/L (ref 22–32)
Calcium: 9.7 mg/dL (ref 8.9–10.3)
Chloride: 102 mmol/L (ref 98–111)
Creatinine, Ser: 1.02 mg/dL (ref 0.61–1.24)
GFR, Estimated: >60 mL/min (ref >60)
Glucose, Bld: 161 mg/dL — ABNORMAL HIGH (ref 70–99)
Potassium: 4 mmol/L (ref 3.5–5.1)
Sodium: 136 mmol/L (ref 135–145)
Total Bilirubin: 0.6 mg/dL (ref <1.2)
Total Protein: 7.4 g/dL (ref 6.5–8.1)

## 2023-06-29 LAB — URINALYSIS, ROUTINE W REFLEX MICROSCOPIC
Bilirubin Urine: NEGATIVE
Glucose, UA: NEGATIVE mg/dL
Ketones, ur: NEGATIVE mg/dL
Leukocytes,Ua: NEGATIVE
Nitrite: NEGATIVE
Protein, ur: NEGATIVE mg/dL
Specific Gravity, Urine: >=1.03 (ref 1.005–1.030)
pH: 5.5 (ref 5.0–8.0)

## 2023-06-29 LAB — URINALYSIS, MICROSCOPIC (REFLEX)

## 2023-06-29 LAB — LIPASE, BLOOD: Lipase: 54 U/L — ABNORMAL HIGH (ref 11–51)

## 2023-06-29 MED ORDER — ONDANSETRON HCL 4 MG/2ML IJ SOLN
4.0000 mg | Freq: Once | INTRAMUSCULAR | Status: AC
  Administered 2023-06-29: 4 mg via INTRAVENOUS
  Filled 2023-06-29: qty 2

## 2023-06-29 MED ORDER — IOHEXOL 300 MG/ML  SOLN
100.0000 mL | Freq: Once | INTRAMUSCULAR | Status: AC | PRN
  Administered 2023-06-29: 100 mL via INTRAVENOUS

## 2023-06-29 MED ORDER — KETOROLAC TROMETHAMINE 30 MG/ML IJ SOLN
15.0000 mg | Freq: Once | INTRAMUSCULAR | Status: AC
  Administered 2023-06-29: 15 mg via INTRAVENOUS
  Filled 2023-06-29: qty 1

## 2023-06-29 MED ORDER — OMEPRAZOLE 20 MG PO CPDR: 20.0000 mg | DELAYED_RELEASE_CAPSULE | Freq: Every day | ORAL | 0 refills | Status: AC

## 2023-06-29 MED ORDER — HYOSCYAMINE SULFATE 0.125 MG SL SUBL
0.2500 mg | SUBLINGUAL_TABLET | Freq: Once | SUBLINGUAL | Status: AC
  Administered 2023-06-29: 0.25 mg via SUBLINGUAL
  Filled 2023-06-29: qty 2

## 2023-06-29 MED ORDER — HYOSCYAMINE SULFATE 0.125 MG SL SUBL: 0.1250 mg | SUBLINGUAL_TABLET | SUBLINGUAL | 0 refills | Status: AC | PRN

## 2023-06-29 MED ORDER — ALUM & MAG HYDROXIDE-SIMETH 200-200-20 MG/5ML PO SUSP
30.0000 mL | Freq: Once | ORAL | Status: AC
  Administered 2023-06-29: 30 mL via ORAL
  Filled 2023-06-29: qty 30

## 2023-06-29 NOTE — ED Provider Notes (Signed)
[place] [facility] Provider Note   CSN: [id number] Arrival date & time: 06/29/23  0134     History  Chief Complaint  Patient presents with   Abdominal Pain    Jerry Dudley is a 37 y.o. male.  The history is provided by the patient.  Abdominal Pain Pain radiates to:  Does not radiate Pain severity:  Moderate Onset quality:  Gradual Duration:  4 days Timing:  Intermittent Progression:  Waxing and waning (worse at night) Chronicity:  New Context: not alcohol use and not diet changes   Relieved by:  Nothing Worsened by:  Nothing Ineffective treatments:  None tried Associated symptoms: diarrhea   Associated symptoms: no fever, no nausea and no vomiting   Risk factors: has not had multiple surgeries   Patient started Tuesday and has been coming and going,  No bowel movement since Tuesday which is atypical for patient but then had diarrhea tonight.  Pain worsened in the evening post dinner which consisted of pot roast and mashed potatoes.  No fevers.       Home Medications Prior to Admission medications   Medication Sig Start Date End Date Taking? Authorizing Provider  losartan (COZAAR) 50 MG tablet Take 1 tablet (50 mg total) by mouth daily. 03/16/21   Carlean Jews, NP  Polyethyl Glycol-Propyl Glycol 0.4-0.3 % SOLN Place 1 drop into both eyes 3 (three) times daily as needed (for dry/irritated eyes.).    [provider]  rosuvastatin (CRESTOR) 5 MG tablet Take 0.5 tablets (2.5 mg total) by mouth daily. 04/14/21   Carlean Jews, NP      Allergies    Patient has no known allergies.    Review of Systems   Review of Systems  Constitutional:  Negative for fever.  Respiratory:  Negative for wheezing and stridor.   Gastrointestinal:  Positive for abdominal pain and diarrhea. Negative for nausea and vomiting.  All other systems reviewed and are negative.   Physical Exam Updated Vital Signs BP (!) 147/98   Pulse (!)  59   Temp 99.6 F (37.6 C) (Oral)   Resp 18   Ht 6\' 3"  (1.905 m)   Wt 97.5 kg   SpO2 97%   BMI 26.87 kg/m  Physical Exam Vitals and nursing note reviewed.  Constitutional:      General: He is not in acute distress.    Appearance: Normal appearance. He is well-developed. He is not diaphoretic.  HENT:     Head: Normocephalic and atraumatic.     Nose: Nose normal.  Eyes:     Conjunctiva/sclera: Conjunctivae normal.     Pupils: Pupils are equal, round, and reactive to light.  Cardiovascular:     Rate and Rhythm: Normal rate and regular rhythm.  Pulmonary:     Effort: Pulmonary effort is normal.     Breath sounds: Normal breath sounds. No wheezing or rales.  Abdominal:     General: Abdomen is flat. Bowel sounds are normal.     Palpations: Abdomen is soft.     Tenderness: There is no abdominal tenderness. There is no guarding or rebound. Negative signs include Murphy's sign, Rovsing's sign and McBurney's sign.     Hernia: No hernia is present.  Musculoskeletal:        General: Normal range of motion.     Cervical back: Normal range of motion and neck supple.  Skin:    General: Skin is warm and dry.  Capillary Refill: Capillary refill takes less than 2 seconds.  Neurological:     General: No focal deficit present.     Mental Status: He is alert and oriented to person, place, and time.  Psychiatric:        Mood and Affect: Mood normal.     ED Results / Procedures / Treatments   Labs (all labs ordered are listed, but only abnormal results are displayed) Results for orders placed or performed during the hospital encounter of 06/29/23  CBC with Differential  Result Value Ref Range   WBC 9.8 4.0 - 10.5 K/uL   RBC 4.96 4.22 - 5.81 MIL/uL   Hemoglobin 14.7 13.0 - 17.0 g/dL   HCT 16.1 09.6 - 04.5 %   MCV 87.3 80.0 - 100.0 fL   MCH 29.6 26.0 - 34.0 pg   MCHC 33.9 30.0 - 36.0 g/dL   RDW 40.9 81.1 - 91.4 %   Platelets 339 150 - 400 K/uL   nRBC 0.0 0.0 - 0.2 %    Neutrophils Relative % 62 %   Neutro Abs 6.0 1.7 - 7.7 K/uL   Lymphocytes Relative 31 %   Lymphs Abs 3.1 0.7 - 4.0 K/uL   Monocytes Relative 6 %   Monocytes Absolute 0.6 0.1 - 1.0 K/uL   Eosinophils Relative 1 %   Eosinophils Absolute 0.1 0.0 - 0.5 K/uL   Basophils Relative 0 %   Basophils Absolute 0.0 0.0 - 0.1 K/uL   Immature Granulocytes 0 %   Abs Immature Granulocytes 0.02 0.00 - 0.07 K/uL  Comprehensive metabolic panel  Result Value Ref Range   Sodium 136 135 - 145 mmol/L   Potassium 4.0 3.5 - 5.1 mmol/L   Chloride 102 98 - 111 mmol/L   CO2 22 22 - 32 mmol/L   Glucose, Bld 161 (H) 70 - 99 mg/dL   BUN 20 6 - 20 mg/dL   Creatinine, Ser 7.82 0.61 - 1.24 mg/dL   Calcium 9.7 8.9 - 95.6 mg/dL   Total Protein 7.4 6.5 - 8.1 g/dL   Albumin 4.4 3.5 - 5.0 g/dL   AST 23 15 - 41 U/L   ALT 34 0 - 44 U/L   Alkaline Phosphatase 42 38 - 126 U/L   Total Bilirubin 0.6 <1.2 mg/dL   GFR, Estimated >21 >30 mL/min   Anion gap 12 5 - 15  Urinalysis, Routine w reflex microscopic -Urine, Clean Catch  Result Value Ref Range   Color, Urine YELLOW YELLOW   APPearance CLEAR CLEAR   Specific Gravity, Urine >=1.030 1.005 - 1.030   pH 5.5 5.0 - 8.0   Glucose, UA NEGATIVE NEGATIVE mg/dL   Hgb urine dipstick SMALL (A) NEGATIVE   Bilirubin Urine NEGATIVE NEGATIVE   Ketones, ur NEGATIVE NEGATIVE mg/dL   Protein, ur NEGATIVE NEGATIVE mg/dL   Nitrite NEGATIVE NEGATIVE   Leukocytes,Ua NEGATIVE NEGATIVE  Urinalysis, Microscopic (reflex)  Result Value Ref Range   RBC / HPF 0-5 0 - 5 RBC/hpf   WBC, UA 0-5 0 - 5 WBC/hpf   Bacteria, UA FEW (A) NONE SEEN   Squamous Epithelial / HPF 0-5 0 - 5 /HPF   Mucus PRESENT    No results found.   Radiology No results found.  Procedures Procedures    Medications Ordered in ED Medications  ondansetron (ZOFRAN) injection 4 mg (4 mg Intravenous Given 06/29/23 0309)  iohexol (OMNIPAQUE) 300 MG/ML solution 100 mL (100 mLs Intravenous Contrast Given 06/29/23 0324)   ketorolac (TORADOL) 30  MG/ML injection 15 mg (15 mg Intravenous Given 06/29/23 0320)    ED Course/ Medical Decision Making/ A&P                                 Medical Decision Making Patient with intermittent abdominal pain worse at night for last few days, did not have BM for 2 days then had diarrhea tonight.  No urinary symptoms.    Amount and/or Complexity of Data Reviewed External Data Reviewed: notes.    Details: Previous notes reviewed  Labs: ordered.    Details: Urine is negative for UTI. Lipase is 54.  Normal LFTs.  White count is normal 9.8, normal hemoglobin 14.7, normal platelets.  Normal sodium  136, normal potassium 4, normal creatinine  Radiology: ordered and independent interpretation performed.    Details: No stones, appendix is normal by me   Risk OTC drugs. Prescription drug management. Risk Details: Well appearing.  Exam, vitals, labs and imaging are benign and reassuring.  Differential is : GERD consistent with this being worse in the evening, ulcer disease, bowel spasm, viral GI illness.  I will treat for GERD and Ulcers with PPI and have advised bland diet.  I have treated for bowel spasms with levsin.  I have advised close follow up with PMD for ongoing care and to order ultrasound for renal lesion seen on CT.  This was also printed on patient's discharge paperwork.  Patient verbalizes understanding and agrees to follow up.  Stable for discharge.  Strict return     Final Clinical Impression(s) / ED Diagnoses Return for intractable cough, coughing up blood, fevers > 100.4 unrelieved by medication, shortness of breath, intractable vomiting, chest pain, shortness of breath, weakness, numbness, changes in speech, facial asymmetry, abdominal pain, passing out, Inability to tolerate liquids or food, cough, altered mental status or any concerns. No signs of systemic illness or infection. The patient is nontoxic-appearing on exam and vital signs are within normal limits.   I have reviewed the triage vital signs and the nursing notes. Pertinent labs & imaging results that were available during my care of the patient were reviewed by me and considered in my medical decision making (see chart for details). After history, exam, and medical workup I feel the patient has been appropriately medically screened and is safe for discharge home. Pertinent diagnoses were discussed with the patient. Patient was given return precautions.    Alizea Pell, MD 06/29/23 [phone]

## 2023-06-29 NOTE — ED Triage Notes (Signed)
Tuesday morning pt developed upper abdominal pain. State off and on during the day and is worse at night, with back pain.

## 2023-06-29 NOTE — ED Notes (Signed)
Pt dispo changed per EDP

## 2023-06-30 ENCOUNTER — Encounter: Payer: Self-pay | Admitting: *Deleted

## 2023-06-30 ENCOUNTER — Other Ambulatory Visit: Payer: Self-pay

## 2023-06-30 ENCOUNTER — Ambulatory Visit
Admission: EM | Admit: 2023-06-30 | Discharge: 2023-06-30 | Disposition: A | Discharge status: Home / Self Care | Payer: BLUE CROSS/BLUE SHIELD | Attending: Internal Medicine | Admitting: Internal Medicine

## 2023-06-30 DIAGNOSIS — R1013 Epigastric pain: Secondary | ICD-10-CM

## 2023-06-30 MED ORDER — ALUM & MAG HYDROXIDE-SIMETH 200-200-20 MG/5ML PO SUSP
30.0000 mL | Freq: Once | ORAL | Status: AC
  Administered 2023-06-30: 30 mL via ORAL

## 2023-06-30 MED ORDER — LIDOCAINE VISCOUS HCL 2 % MT SOLN
15.0000 mL | Freq: Once | OROMUCOSAL | Status: AC
  Administered 2023-06-30: 15 mL via OROMUCOSAL

## 2023-06-30 NOTE — Discharge Instructions (Addendum)
Continue omeprazole and hyoscyamine as prescribed from the ER.  You may add on Maalox over-the-counter as well as Tums.  Avoid spicy or fried foods.  Avoid over-the-counter NSAIDs such as Advil, ibuprofen, Aleve, naproxen.  Follow-up with your PCP at your scheduled appointment in 2 days.  Please go back to the ER if you develop any worsening symptoms before you see your PCP.  I hope you feel better soon!

## 2023-06-30 NOTE — ED Triage Notes (Signed)
Seen in the ED this week and eval for abd pain. Still having upper abd pain. Just started omperazole. Scheduled with PCP for Monday. ? If there is another med to help with the pain until he sees his PCP. Pt states pain is worse at night

## 2023-06-30 NOTE — ED Provider Notes (Signed)
[name]    CSN: [id number] Arrival date & time: 06/30/23  1200      History   Chief Complaint Chief Complaint  Patient presents with   Abdominal Pain    HPI Jerry Dudley is a 37 y.o. male presents for epigastric pain.  Patient was seen in the emergency room yesterday for complaints of epigastric/mid abdominal pain that is worse in the evening/laying down.  Describes it as a burning type pain that does not radiate.  He had blood work and a CT abdomen pelvis that showed no abnormalities with the exception of a "Complex hypodensity in the upper pole of the right kidney" which she was advised to follow-up with outpatient for this.  He was treated for potential GERD with omeprazole and also given hyoscyamine sublingual to use every 4 hours.  He is taken 1 dose of the omeprazole and a couple doses of the hyoscyamine without change in symptoms.  He is requesting something to help with the pain until he is able to see his PCP which she is scheduled to see on Monday 11/11.  He denies any vomiting, fevers.  Reports he is only had 1 bowel movement since yesterday which is abnormal for him.  Denies any change in diet or new medications.  Denies chronic NSAID usage.  No history of GERD.  No other concerns at this time.   Abdominal Pain   Past Medical History:  Diagnosis Date   Hypertension     Patient Active Problem List   Diagnosis Date Noted   Snoring 04/25/2021   Other fatigue 04/25/2021   Elevated blood-pressure reading without diagnosis of hypertension 04/14/2021   Hyperlipidemia LDL goal <100 04/14/2021   Other examination of ears and hearing 04/14/2021   Posttraumatic stress disorder 04/14/2021   Pure hypercholesterolemia 04/14/2021   Encounter to establish care 03/27/2021   Body mass index 26.0-26.9, adult 03/27/2021   Lymphadenopathy 12/10/2017   Acute maxillary sinusitis 11/27/2017   Cough in adult 11/27/2017   Healthcare maintenance 09/27/2017   Skin  infection 09/27/2017   Essential hypertension 09/27/2017    History reviewed. No pertinent surgical history.     Home Medications    Prior to Admission medications   Medication Sig Start Date End Date Taking? Authorizing Provider  hyoscyamine (LEVSIN/SL) 0.125 MG SL tablet Place 1 tablet (0.125 mg total) under the tongue every 4 (four) hours as needed. 06/29/23  Yes Palumbo, April, MD  losartan (COZAAR) 50 MG tablet Take 1 tablet (50 mg total) by mouth daily. 03/16/21  Yes Boscia, Kathlynn Grate, NP  omeprazole (PRILOSEC) 20 MG capsule Take 1 capsule (20 mg total) by mouth daily. 06/29/23  Yes Palumbo, April, MD  rosuvastatin (CRESTOR) 5 MG tablet Take 0.5 tablets (2.5 mg total) by mouth daily. 04/14/21  Yes Boscia, Kathlynn Grate, NP  Polyethyl Glycol-Propyl Glycol 0.4-0.3 % SOLN Place 1 drop into both eyes 3 (three) times daily as needed (for dry/irritated eyes.).    [provider]    Family History Family History  Problem Relation Age of Onset   Healthy Mother    Healthy Father    High blood pressure Father    Healthy Sister    Healthy Daughter     Social History Social History   Tobacco Use   Smoking status: Never   Smokeless tobacco: Current    Types: Snuff  Vaping Use   Vaping status: Never Used  Substance Use Topics   Alcohol use: Yes    Alcohol/week:  2.0 standard drinks of alcohol    Types: 2 Cans of beer per week    Comment: seldom   Drug use: No     Allergies   Patient has no known allergies.   Review of Systems Review of Systems  Gastrointestinal:  Positive for abdominal pain.     Physical Exam Triage Vital Signs ED Triage Vitals  Encounter Vitals Group     BP 06/30/23 1240 (!) 131/91     Systolic BP Percentile --      Diastolic BP Percentile --      Pulse Rate 06/30/23 1240 93     Resp 06/30/23 1240 18     Temp 06/30/23 1240 97.9 F (36.6 C)     Temp Source 06/30/23 1240 Oral     SpO2 06/30/23 1240 97 %     Weight --      Height --       Head Circumference --      Peak Flow --      Pain Score 06/30/23 1241 2     Pain Loc --      Pain Education --      Exclude from Growth Chart --    No data found.  Updated Vital Signs BP (!) 131/91 (BP Location: Left Arm)   Pulse 93   Temp 97.9 F (36.6 C) (Oral)   Resp 18   SpO2 97%   Visual Acuity Right Eye Distance:   Left Eye Distance:   Bilateral Distance:    Right Eye Near:   Left Eye Near:    Bilateral Near:     Physical Exam Vitals and nursing note reviewed.  Constitutional:      General: He is not in acute distress.    Appearance: Normal appearance. He is not ill-appearing.  HENT:     Head: Normocephalic and atraumatic.  Eyes:     Pupils: Pupils are equal, round, and reactive to light.  Cardiovascular:     Rate and Rhythm: Normal rate.  Pulmonary:     Effort: Pulmonary effort is normal.  Abdominal:     General: Bowel sounds are normal. There is no distension.     Palpations: Abdomen is soft.     Tenderness: There is no abdominal tenderness. There is no right CVA tenderness, left CVA tenderness, guarding or rebound.  Skin:    General: Skin is warm and dry.  Neurological:     General: No focal deficit present.     Mental Status: He is alert and oriented to person, place, and time.  Psychiatric:        Mood and Affect: Mood normal.        Behavior: Behavior normal.      UC Treatments / Results  Labs (all labs ordered are listed, but only abnormal results are displayed) Labs Reviewed - No data to display  EKG   Radiology CT ABDOMEN PELVIS W CONTRAST  Result Date: 06/29/2023 CLINICAL DATA:  Upper abdominal pain, intermittent, worse at night with back pain. EXAM: CT ABDOMEN AND PELVIS WITH CONTRAST TECHNIQUE: Multidetector CT imaging of the abdomen and pelvis was performed using the standard protocol following bolus administration of intravenous contrast. RADIATION DOSE REDUCTION: This exam was performed according to the departmental  dose-optimization program which includes automated exposure control, adjustment of the mA and/or kV according to patient size and/or use of iterative reconstruction technique. CONTRAST:  OMNIPAQUE IOHEXOL 300 MG/ML  SOLN COMPARISON:  None Available. FINDINGS: Lower chest: No  acute abnormality. Hepatobiliary: Vague hypodensity is noted in the left lobe of the liver measuring 1.3 cm with suggestion of peripheral puddling of contrast on coronal image 98, suggesting hemangioma. No biliary ductal dilatation. The gallbladder is without stones. Pancreas: Unremarkable. No pancreatic ductal dilatation or surrounding inflammatory changes. Spleen: Normal in size without focal abnormality. Adrenals/Urinary Tract: The adrenal glands are within normal limits. The kidneys enhance symmetrically. A complex hypodensity is noted in the upper pole of the right kidney measuring 8 mm with attenuation of 61 Hounsfield units. A cyst is present in the lower pole of the left kidney. Nonobstructive renal calculi are noted bilaterally. No ureteral calculus or obstructive uropathy. The bladder is unremarkable. Stomach/Bowel: Stomach is within normal limits. Appendix appears normal. No evidence of bowel wall thickening, distention, or inflammatory changes. No free air or pneumatosis. Vascular/Lymphatic: No significant vascular findings are present. No enlarged abdominal or pelvic lymph nodes. Reproductive: Prostate is unremarkable. Other: No abdominopelvic ascites. Musculoskeletal: No acute osseous abnormality. IMPRESSION: 1. No acute intra-abdominal process. 2. Bilateral nephrolithiasis without obstructive uropathy. 3. Complex hypodensity in the upper pole of the right kidney. Nonemergent ultrasound is suggested for further characterization on follow-up. Electronically Signed   By: Thornell Sartorius M.D.   On: 06/29/2023 04:03    Procedures Procedures (including critical care time)  Medications Ordered in UC Medications  alum & mag  hydroxide-simeth (MAALOX/MYLANTA) 200-200-20 MG/5ML suspension 30 mL (30 mLs Oral Given 06/30/23 1320)  lidocaine (XYLOCAINE) 2 % viscous mouth solution 15 mL (15 mLs Mouth/Throat Given 06/30/23 1320)    Initial Impression / Assessment and Plan / UC Course  I have reviewed the triage vital signs and the nursing notes.  Pertinent labs & imaging results that were available during my care of the patient were reviewed by me and considered in my medical decision making (see chart for details).     Reviewed exam and symptoms with patient.  No red flags.  Patient given GI cocktail in clinic with improvement in symptoms.  Advised patient to continue omeprazole and hyoscyamine as prescribed from the ER.  May add on over-the-counter Maalox and Tums as needed.  Discussed bland foods and avoidance of OTC NSAIDs.  Patient to follow-up with PCP as scheduled appointment in 2 days.  ER precautions reviewed. Final Clinical Impressions(s) / UC Diagnoses   Final diagnoses:  Abdominal pain, epigastric     Discharge Instructions      Continue omeprazole and hyoscyamine as prescribed from the ER.  You may add on Maalox over-the-counter as well as Tums.  Avoid spicy or fried foods.  Avoid over-the-counter NSAIDs such as Advil, ibuprofen, Aleve, naproxen.  Follow-up with your PCP at your scheduled appointment in 2 days.  Please go back to the ER if you develop any worsening symptoms before you see your PCP.  I hope you feel better soon!     ED Prescriptions   None    PDMP not reviewed this encounter.   Radford Pax, NP 06/30/23 1352

## 2023-07-02 ENCOUNTER — Encounter: Payer: Self-pay | Admitting: Family Medicine

## 2023-07-02 ENCOUNTER — Ambulatory Visit: Payer: BLUE CROSS/BLUE SHIELD | Admitting: Family Medicine

## 2023-07-02 VITALS — BP 117/82 | HR 96 | Ht 75.0 in | Wt 218.0 lb

## 2023-07-02 DIAGNOSIS — E785 Hyperlipidemia, unspecified: Secondary | ICD-10-CM

## 2023-07-02 DIAGNOSIS — N289 Disorder of kidney and ureter, unspecified: Secondary | ICD-10-CM | POA: Diagnosis not present

## 2023-07-02 DIAGNOSIS — R1013 Epigastric pain: Secondary | ICD-10-CM | POA: Insufficient documentation

## 2023-07-02 NOTE — Patient Instructions (Signed)
It was nice to see you today,  We addressed the following topics today: -I will send in a referral to the gastroenterologist - I will put in an order for a ultrasound of your kidney - Someone will call you to schedule both of these - You can take the Prilosec twice a day for the next 2 weeks and then taper back to once daily - I would like to see you back in a month so we can talk about chronic issues like your cholesterol levels.  Have a great day,  Frederic Jericho, MD

## 2023-07-02 NOTE — Assessment & Plan Note (Signed)
Patient's LDL cholesterol level and 4098 was over 200.  He was prescribed Crestor but stopped taking it.  Does not seem it was ever rechecked.  Discussed indications of taking cholesterol medication including LDL greater than 190. - Recheck cholesterol level - Discussed medication at next visit.

## 2023-07-02 NOTE — Progress Notes (Signed)
   Established Patient Office Visit  Subjective   Patient ID: Jerry Dudley, male    DOB: 1986/07/07  Age: 37 y.o. MRN: [id number]  Chief Complaint  Patient presents with   Follow-up    HPI   Patient had a CT scan of the abdomen pelvis in the ED for dyspepsia symptoms and was found to have a right kidney upper pole lesion and follow-up with ultrasound was recommended.  patient has no history of kidney disease.  Abdominal pain-patient states on last Tuesday morning he had severe epigastric pain that woke him up, described as burning in the upper abdomen.  Discontinue the next few days until he went to the emergency department on Thursday.  Pain is usually worse when he lay down at night.  No nausea.  Did make himself vomit on Tuesday thinking that would make him feel better.  Had 1 episode of diarrhea but otherwise normal bowel movements.  Since taking the omeprazole and hyoscyamine he feels a lot better although not 100%.  Pain does not seem to be associated with food intake.   The ASCVD Risk score (Arnett DK, et al., 2019) failed to calculate for the following reasons:   The 2019 ASCVD risk score is only valid for ages 11 to 54  Health Maintenance Due  Topic Date Due   Hepatitis C Screening  Never done   DTaP/Tdap/Td (2 - Td or Tdap) 08/22/2019   INFLUENZA VACCINE  03/22/2023   COVID-19 Vaccine (1 - 2023-24 season) Never done      Objective:     BP 117/82   Pulse 96   Ht 6\' 3"  (1.905 m)   Wt 218 lb (98.9 kg)   SpO2 98%   BMI 27.25 kg/m    Physical Exam General: Alert, oriented CV, regular rhythm GI: Soft, nontender palpation.  Normal bowel sounds.   No results found for any visits on 07/02/23.      Assessment & Plan:   Renal lesion Assessment & Plan: Right upper pole of kidney shows "8 mm complex hypodensity" - Follow-up renal ultrasound  Orders: -     US RENAL; Future  Dyspepsia Assessment & Plan: Sudden onset epigastric pain approximately 1 week  ago.  No history of reflux.  No regurgitation symptoms.  Improving with Prilosec.  CT scan of the abdomen did not show any concerning findings.  No nausea and does not seem to be related to food intake. - Referral to gastroenterology - Increase omeprazole to twice a day for the next 2 weeks and then decrease back to nightly  Orders: -     Ambulatory referral to Gastroenterology  Hyperlipidemia LDL goal <100 Assessment & Plan: Patient's LDL cholesterol level and 4401 was over 200.  He was prescribed Crestor but stopped taking it.  Does not seem it was ever rechecked.  Discussed indications of taking cholesterol medication including LDL greater than 190. - Recheck cholesterol level - Discussed medication at next visit.      Return in about 4 weeks (around 07/30/2023) for hld.    Sandre Kitty, MD

## 2023-07-02 NOTE — Assessment & Plan Note (Signed)
Sudden onset epigastric pain approximately 1 week ago.  No history of reflux.  No regurgitation symptoms.  Improving with Prilosec.  CT scan of the abdomen did not show any concerning findings.  No nausea and does not seem to be related to food intake. - Referral to gastroenterology - Increase omeprazole to twice a day for the next 2 weeks and then decrease back to nightly

## 2023-07-02 NOTE — Assessment & Plan Note (Signed)
Right upper pole of kidney shows "8 mm complex hypodensity" - Follow-up renal ultrasound

## 2023-07-05 ENCOUNTER — Ambulatory Visit
Admission: RE | Admit: 2023-07-05 | Discharge: 2023-07-05 | Disposition: A | Discharge status: Home / Self Care | Payer: BLUE CROSS/BLUE SHIELD | Source: Ambulatory Visit | Attending: Family Medicine | Admitting: Family Medicine

## 2023-07-05 ENCOUNTER — Other Ambulatory Visit: Payer: Self-pay | Admitting: Family Medicine

## 2023-07-05 ENCOUNTER — Other Ambulatory Visit: Payer: Self-pay

## 2023-07-05 DIAGNOSIS — N289 Disorder of kidney and ureter, unspecified: Secondary | ICD-10-CM

## 2023-07-05 DIAGNOSIS — E785 Hyperlipidemia, unspecified: Secondary | ICD-10-CM

## 2023-07-05 DIAGNOSIS — I10 Essential (primary) hypertension: Secondary | ICD-10-CM

## 2023-07-05 DIAGNOSIS — N2889 Other specified disorders of kidney and ureter: Secondary | ICD-10-CM

## 2023-07-16 ENCOUNTER — Telehealth: Payer: Self-pay

## 2023-07-16 ENCOUNTER — Other Ambulatory Visit: Payer: Self-pay | Admitting: Family Medicine

## 2023-07-16 MED ORDER — LORAZEPAM 0.5 MG PO TABS: ORAL_TABLET | ORAL | 0 refills | Status: DC

## 2023-07-16 NOTE — Telephone Encounter (Signed)
I sent in 1 dose of Ativan for him to take 15 minutes prior to the MRI.

## 2023-07-16 NOTE — Telephone Encounter (Signed)
Pt requesting medication to aide with anxiety during MRI tomorrow.

## 2023-07-17 ENCOUNTER — Ambulatory Visit (HOSPITAL_COMMUNITY)
Admission: RE | Admit: 2023-07-17 | Discharge: 2023-07-17 | Disposition: A | Discharge status: Home / Self Care | Payer: BLUE CROSS/BLUE SHIELD | Source: Ambulatory Visit | Attending: Family Medicine | Admitting: Family Medicine

## 2023-07-17 DIAGNOSIS — N2889 Other specified disorders of kidney and ureter: Secondary | ICD-10-CM | POA: Insufficient documentation

## 2023-07-17 DIAGNOSIS — N281 Cyst of kidney, acquired: Secondary | ICD-10-CM | POA: Diagnosis not present

## 2023-07-17 DIAGNOSIS — K760 Fatty (change of) liver, not elsewhere classified: Secondary | ICD-10-CM | POA: Diagnosis not present

## 2023-07-17 DIAGNOSIS — D1803 Hemangioma of intra-abdominal structures: Secondary | ICD-10-CM | POA: Diagnosis not present

## 2023-07-17 DIAGNOSIS — N289 Disorder of kidney and ureter, unspecified: Secondary | ICD-10-CM | POA: Diagnosis not present

## 2023-07-17 MED ORDER — GADOBUTROL 1 MMOL/ML IV SOLN
10.0000 mL | Freq: Once | INTRAVENOUS | Status: AC | PRN
  Administered 2023-07-17: 10 mL via INTRAVENOUS

## 2023-07-17 NOTE — Telephone Encounter (Signed)
Left voicemail requesting a return call.

## 2023-07-23 ENCOUNTER — Other Ambulatory Visit: Payer: BLUE CROSS/BLUE SHIELD

## 2023-07-23 DIAGNOSIS — I10 Essential (primary) hypertension: Secondary | ICD-10-CM

## 2023-07-23 DIAGNOSIS — E785 Hyperlipidemia, unspecified: Secondary | ICD-10-CM

## 2023-07-24 LAB — COMPREHENSIVE METABOLIC PANEL
ALT: 29 [IU]/L (ref 0–44)
AST: 22 [IU]/L (ref 0–40)
Albumin: 4.9 g/dL (ref 4.1–5.1)
Alkaline Phosphatase: 57 [IU]/L (ref 44–121)
BUN/Creatinine Ratio: 17 (ref 9–20)
BUN: 14 mg/dL (ref 6–20)
Bilirubin Total: 0.3 mg/dL (ref 0.0–1.2)
CO2: 24 mmol/L (ref 20–29)
Calcium: 10.3 mg/dL — ABNORMAL HIGH (ref 8.7–10.2)
Chloride: 103 mmol/L (ref 96–106)
Creatinine, Ser: 0.81 mg/dL (ref 0.76–1.27)
Globulin, Total: 2.3 g/dL (ref 1.5–4.5)
Glucose: 97 mg/dL (ref 70–99)
Potassium: 4.6 mmol/L (ref 3.5–5.2)
Sodium: 142 mmol/L (ref 134–144)
Total Protein: 7.2 g/dL (ref 6.0–8.5)
eGFR: 116 mL/min/{1.73_m2} (ref >59)

## 2023-07-24 LAB — LIPID PANEL
Chol/HDL Ratio: 6 {ratio} — ABNORMAL HIGH (ref 0.0–5.0)
Cholesterol, Total: 292 mg/dL — ABNORMAL HIGH (ref 100–199)
HDL: 49 mg/dL (ref >39)
LDL Chol Calc (NIH): 205 mg/dL — ABNORMAL HIGH (ref 0–99)
Triglycerides: 198 mg/dL — ABNORMAL HIGH (ref 0–149)
VLDL Cholesterol Cal: 38 mg/dL (ref 5–40)

## 2023-07-24 LAB — HEMOGLOBIN A1C
Est. average glucose Bld gHb Est-mCnc: 126 mg/dL
Hgb A1c MFr Bld: 6 % — ABNORMAL HIGH (ref 4.8–5.6)

## 2023-07-30 ENCOUNTER — Ambulatory Visit: Payer: BLUE CROSS/BLUE SHIELD | Admitting: Family Medicine

## 2023-07-30 ENCOUNTER — Encounter: Payer: Self-pay | Admitting: Family Medicine

## 2023-07-30 VITALS — BP 148/93 | HR 87 | Ht 75.0 in | Wt 221.8 lb

## 2023-07-30 DIAGNOSIS — I10 Essential (primary) hypertension: Secondary | ICD-10-CM

## 2023-07-30 DIAGNOSIS — R7303 Prediabetes: Secondary | ICD-10-CM | POA: Diagnosis not present

## 2023-07-30 DIAGNOSIS — E785 Hyperlipidemia, unspecified: Secondary | ICD-10-CM | POA: Diagnosis not present

## 2023-07-30 DIAGNOSIS — K760 Fatty (change of) liver, not elsewhere classified: Secondary | ICD-10-CM | POA: Diagnosis not present

## 2023-07-30 MED ORDER — LOSARTAN POTASSIUM 50 MG PO TABS
50.0000 mg | ORAL_TABLET | Freq: Every day | ORAL | 1 refills | Status: AC
Start: 2023-07-30 — End: ?

## 2023-07-30 MED ORDER — ROSUVASTATIN CALCIUM 5 MG PO TABS: 5.0000 mg | ORAL_TABLET | Freq: Every day | ORAL | 1 refills | Status: DC

## 2023-07-30 NOTE — Progress Notes (Unsigned)
Established Patient Office Visit  Subjective   Patient ID: Jerry Dudley, male    DOB: 09-24-1985  Age: 37 y.o. MRN: [id number]  Chief Complaint  Patient presents with   Medical Management of Chronic Issues    HPI  HLD -we discussed the patient's elevated cholesterol levels.  Both of his parents are on cholesterol medications.  He states the last time he took Crestor he just did not continue to take it after the first bottle ran out.  Does not recall have any issues with tolerating it last time.  Prediabetes -we discussed the patient's A1c of 6.0.  We discussed dietary changes he can make.  We discussed the patient's MRI finding of the benign renal cyst.  Discussed the fatty liver incidental finding.  Discussed ways to improve this with diet and treating his hypercholesterolemia.  Patient does not drink alcohol.  Describes himself as "active".  Hypertension-patient does not check his blood pressure at home, but states that his blood pressure might be high today because he is nervous about discussing his lab findings.   The ASCVD Risk score (Arnett DK, et al., 2019) failed to calculate for the following reasons:   The 2019 ASCVD risk score is only valid for ages 32 to 70  Health Maintenance Due  Topic Date Due   Hepatitis C Screening  Never done   DTaP/Tdap/Td (3 - Td or Tdap) 10/30/2019   COVID-19 Vaccine (1 - 2023-24 season) Never done      Objective:     BP (!) 148/93   Pulse 87   Ht 6\' 3"  (1.905 m)   Wt 221 lb 12.8 oz (100.6 kg)   SpO2 99%   BMI 27.72 kg/m  {Vitals History (Optional):23777}  Physical Exam General: Alert, oriented.  Athletic appearing male Pulmonary: No resp distress Psych: Pleasant affect    No results found for any visits on 07/30/23.      Assessment & Plan:   Hepatic steatosis Assessment & Plan: Incidental finding on his MRI for renal cyst.  Likely related to his cholesterol levels.  Is at a healthy weight, does not drink alcohol  regularly.  Diet is not overly heavy in saturated fats   Orders: -     Comprehensive metabolic panel; Future  Essential hypertension Assessment & Plan: Takes losartan.  Elevated today.  Advised him to check his blood pressure at home with a home blood pressure cuff, record the values and bring them back to [place] at our next visit in approximately 8 weeks.  At that time can we can discuss changes to medication.  Orders: -     Losartan Potassium; Take 1 tablet (50 mg total) by mouth daily.  Dispense: 90 tablet; Refill: 1  Hyperlipidemia LDL goal <100 Assessment & Plan: Repeat cholesterol was also greater than 190.  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  Patient agreeable to restarting Crestor.  Will send that in today.  Discussed limiting saturated fats to less than 10 g/day.  Recheck in 6 to 7 weeks.  Orders: -     Rosuvastatin Calcium; Take 1 tablet (5 mg total) by mouth daily.  Dispense: 90 tablet; Refill: 1 -     Lipid panel; Future  Prediabetes Assessment & Plan: A1c 6.0 today.  Discussed foods that are high in simple carbs and sugar and limiting these.  Recheck in 6 to 12 months.      Return in about 7 weeks (around 09/17/2023).    Sandre Kitty, MD

## 2023-07-30 NOTE — Assessment & Plan Note (Addendum)
Repeat cholesterol was also greater than 190.  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  Patient agreeable to restarting Crestor.  Will send that in today.  Discussed limiting saturated fats to less than 10 g/day.  Recheck in 6 to 7 weeks.

## 2023-07-30 NOTE — Assessment & Plan Note (Signed)
A1c 6.0 today.  Discussed foods that are high in simple carbs and sugar and limiting these.  Recheck in 6 to 12 months.

## 2023-07-30 NOTE — Patient Instructions (Signed)
It was nice to see you today,  We addressed the following topics today: -Your blood pressure was elevated.  Please get a home blood pressure cuff and check your blood pressure 1-2 times a day for 1 to 2 weeks and bring the results with you the ICU - I have started a medication called Crestor for your cholesterol.  Take this once a day.  In 6 weeks we will recheck it.   - Your imaging showed fatty liver.  To treat this, reduce your intake of saturated fats in the diet such as those from red meat, dairy products such as bacon, sausage, hamburger, butter, cheese.  Limit your saturated fat intake to less than 10 g/day of saturated fat. - Your A1c was in the prediabetic range at 6.0.  To prevent progression to diabetes you can reduce your sugar intake by eliminating sugar and drinks such as sodas teas or juice.  Also reducing your intake of breads, pasta, rice or other carbohydrates.  Have a great day,  Frederic Jericho, MD

## 2023-07-30 NOTE — Assessment & Plan Note (Signed)
Takes losartan.  Elevated today.  Advised him to check his blood pressure at home with a home blood pressure cuff, record the values and bring them back to [place] at our next visit in approximately 8 weeks.  At that time can we can discuss changes to medication.

## 2023-07-30 NOTE — Assessment & Plan Note (Signed)
Incidental finding on his MRI for renal cyst.  Likely related to his cholesterol levels.  Is at a healthy weight, does not drink alcohol regularly.  Diet is not overly heavy in saturated fats

## 2023-09-10 ENCOUNTER — Other Ambulatory Visit: Payer: BC Managed Care – PPO

## 2023-09-10 DIAGNOSIS — K760 Fatty (change of) liver, not elsewhere classified: Secondary | ICD-10-CM

## 2023-09-10 DIAGNOSIS — E785 Hyperlipidemia, unspecified: Secondary | ICD-10-CM

## 2023-09-11 ENCOUNTER — Encounter: Payer: Self-pay | Admitting: Family Medicine

## 2023-09-11 LAB — COMPREHENSIVE METABOLIC PANEL
ALT: 42 [IU]/L (ref 0–44)
AST: 22 [IU]/L (ref 0–40)
Albumin: 4.9 g/dL (ref 4.1–5.1)
Alkaline Phosphatase: 57 [IU]/L (ref 44–121)
BUN/Creatinine Ratio: 19 (ref 9–20)
BUN: 16 mg/dL (ref 6–20)
Bilirubin Total: 0.3 mg/dL (ref 0.0–1.2)
CO2: 22 mmol/L (ref 20–29)
Calcium: 10.1 mg/dL (ref 8.7–10.2)
Chloride: 104 mmol/L (ref 96–106)
Creatinine, Ser: 0.86 mg/dL (ref 0.76–1.27)
Globulin, Total: 2.6 g/dL (ref 1.5–4.5)
Glucose: 108 mg/dL — ABNORMAL HIGH (ref 70–99)
Potassium: 4.7 mmol/L (ref 3.5–5.2)
Sodium: 143 mmol/L (ref 134–144)
Total Protein: 7.5 g/dL (ref 6.0–8.5)
eGFR: 114 mL/min/{1.73_m2} (ref >59)

## 2023-09-11 LAB — LIPID PANEL
Chol/HDL Ratio: 3.3 {ratio} (ref 0.0–5.0)
Cholesterol, Total: 170 mg/dL (ref 100–199)
HDL: 51 mg/dL (ref >39)
LDL Chol Calc (NIH): 105 mg/dL — ABNORMAL HIGH (ref 0–99)
Triglycerides: 72 mg/dL (ref 0–149)
VLDL Cholesterol Cal: 14 mg/dL (ref 5–40)

## 2023-09-17 ENCOUNTER — Ambulatory Visit (INDEPENDENT_AMBULATORY_CARE_PROVIDER_SITE_OTHER): Payer: BC Managed Care – PPO | Admitting: Family Medicine

## 2023-09-17 ENCOUNTER — Encounter: Payer: Self-pay | Admitting: Family Medicine

## 2023-09-17 VITALS — BP 119/84 | HR 75 | Ht 75.0 in | Wt 220.4 lb

## 2023-09-17 DIAGNOSIS — I10 Essential (primary) hypertension: Secondary | ICD-10-CM | POA: Diagnosis not present

## 2023-09-17 DIAGNOSIS — E785 Hyperlipidemia, unspecified: Secondary | ICD-10-CM

## 2023-09-17 DIAGNOSIS — R7303 Prediabetes: Secondary | ICD-10-CM

## 2023-09-17 MED ORDER — ROSUVASTATIN CALCIUM 10 MG PO TABS
10.0000 mg | ORAL_TABLET | Freq: Every day | ORAL | 1 refills | Status: AC
Start: 2023-09-17 — End: ?

## 2023-09-17 NOTE — Patient Instructions (Signed)
It was nice to see you today,  We addressed the following topics today: -I will send in a prescription for an increased dose of Crestor.  This will be 10 mg.  If you do not tolerate this you can go back down to the 5 mg. - I will follow-up with you in 3 months for a physical.  At that time we can recheck your A1c and your cholesterol level the week prior and if everything is under control we can do annual visits after that.  Have a great day,  Frederic Jericho, MD

## 2023-09-17 NOTE — Progress Notes (Unsigned)
   Established Patient Office Visit  Subjective   Patient ID: Jerry Dudley, male    DOB: September 08, 1985  Age: 38 y.o. MRN: [id number]  Chief Complaint  Patient presents with   Medical Management of Chronic Issues    HPI  HLD-patient is taking his Crestor 5 mg daily.  Tolerating it well.  No concerns.  We discussed his improvement in his lipid panel from 205 to 105 for LDL.  Discussed goal of 100 or less.  Patient agreeable to increasing to 10 mg.  Hypertension-patient taking his losartan.  No questions or concerns regarding this.  Tolerating it well.    The ASCVD Risk score (Arnett DK, et al., 2019) failed to calculate for the following reasons:   The 2019 ASCVD risk score is only valid for ages 16 to 76  Health Maintenance Due  Topic Date Due   Hepatitis C Screening  Never done   DTaP/Tdap/Td (3 - Td or Tdap) 10/30/2019   COVID-19 Vaccine (1 - 2024-25 season) Never done      Objective:     BP 119/84   Pulse 75   Ht 6\' 3"  (1.905 m)   Wt 220 lb 6.4 oz (100 kg)   SpO2 98%   BMI 27.55 kg/m  {Vitals History (Optional):23777}  Physical Exam General: Alert, oriented Pulmonary: No respiratory stress Psych: Pleasant affect.   No results found for any visits on 09/17/23.      Assessment & Plan:   Essential hypertension Assessment & Plan: Tolerating losartan.  Blood pressure at goal today.  Continue current dose.  Recheck CMP at next visit in 3 months.   Hyperlipidemia LDL goal <100 Assessment & Plan: Discussed his improvement in LDL which was significant.  Was barely above the goal of 100 and discussed increasing Crestor versus rechecking again in 3 months.  Patient agreeable to increasing.  Advised him if he develops side effects let us know and he can go back down to 5 mg.  Follow-up in 3 months with lipid panel and A1c.  Orders: -     Rosuvastatin Calcium; Take 1 tablet (10 mg total) by mouth daily.  Dispense: 90 tablet; Refill: 1  Prediabetes Assessment &  Plan: Will recheck A1c again in 3 months given that he is now on a statin which could potentially worsen his A1c.  If it is stable we will recheck yearly.      Return in about 3 months (around 12/16/2023) for physical.    Sandre Kitty, MD

## 2023-09-17 NOTE — Assessment & Plan Note (Signed)
Tolerating losartan.  Blood pressure at goal today.  Continue current dose.  Recheck CMP at next visit in 3 months.

## 2023-09-17 NOTE — Assessment & Plan Note (Signed)
Will recheck A1c again in 3 months given that he is now on a statin which could potentially worsen his A1c.  If it is stable we will recheck yearly.

## 2023-09-17 NOTE — Assessment & Plan Note (Signed)
Discussed his improvement in LDL which was significant.  Was barely above the goal of 100 and discussed increasing Crestor versus rechecking again in 3 months.  Patient agreeable to increasing.  Advised him if he develops side effects let us know and he can go back down to 5 mg.  Follow-up in 3 months with lipid panel and A1c.

## 2023-12-05 ENCOUNTER — Other Ambulatory Visit: Payer: Self-pay | Admitting: *Deleted

## 2023-12-05 DIAGNOSIS — R7303 Prediabetes: Secondary | ICD-10-CM

## 2023-12-05 DIAGNOSIS — E785 Hyperlipidemia, unspecified: Secondary | ICD-10-CM

## 2023-12-05 DIAGNOSIS — I10 Essential (primary) hypertension: Secondary | ICD-10-CM

## 2023-12-10 ENCOUNTER — Other Ambulatory Visit: Payer: BC Managed Care – PPO

## 2023-12-10 DIAGNOSIS — R7303 Prediabetes: Secondary | ICD-10-CM

## 2023-12-10 DIAGNOSIS — E785 Hyperlipidemia, unspecified: Secondary | ICD-10-CM

## 2023-12-10 DIAGNOSIS — I10 Essential (primary) hypertension: Secondary | ICD-10-CM

## 2023-12-17 ENCOUNTER — Encounter: Payer: BC Managed Care – PPO | Admitting: Family Medicine

## 2024-03-02 DIAGNOSIS — R509 Fever, unspecified: Secondary | ICD-10-CM | POA: Diagnosis not present

## 2024-03-02 DIAGNOSIS — R519 Headache, unspecified: Secondary | ICD-10-CM | POA: Diagnosis not present

## 2024-03-02 DIAGNOSIS — J029 Acute pharyngitis, unspecified: Secondary | ICD-10-CM | POA: Diagnosis not present

## 2024-05-05 ENCOUNTER — Ambulatory Visit: Payer: Self-pay | Admitting: *Deleted

## 2024-05-05 NOTE — Telephone Encounter (Signed)
 FYI Only or Action Required?: Action required by provider: request for appointment, update on patient condition, and requesting if appt can be later in the day due to work or with other provider today .  Patient was last seen in primary care on 09/17/2023 by Chandra Toribio POUR, MD.  Called Nurse Triage reporting Anxiety.  Symptoms began several days ago.  Interventions attempted: Rest, hydration, or home remedies and Other: took wife's xanax and felt better but still feels anxious.  Symptoms are: gradually worsening.  Triage Disposition: See Physician Within 24 Hours  Patient/caregiver understands and will follow disposition?: Yes                Copied from CRM #[id number]. Topic: Clinical - Red Word Triage >> May 05, 2024  9:23 AM Wess RAMAN wrote: Red Word that prompted transfer to Nurse Triage:  Severe anxiety and panic attacks the past few weeks. Had a panic attack on Saturday and would like an appt with PCP for medication Reason for Disposition  Patient sounds very upset or troubled to the triager  Answer Assessment - Initial Assessment Questions Earliest appt scheduled for tomorrow. Patient requesting if appt can be later in afternoon due to work. Please advise. Recommended if sx worsen call urgent crisis center #[phone] or go to ED if sx worsen.      1. CONCERN: Did anything happen that prompted you to call today?      Panic attack Saturday and still feeling anxiety  2. ANXIETY SYMPTOMS: Can you describe how you (your loved one; patient) have been feeling? (e.g., tense, restless, panicky, anxious, keyed up, overwhelmed, sense of impending doom).      Anxiety feels like will have another panic attack  3. ONSET: How long have you been feeling this way? (e.g., hours, days, weeks)     Saturday  4. SEVERITY: How would you rate the level of anxiety? (e.g., 0 - 10; or mild, moderate, severe).     Moderate  5. FUNCTIONAL IMPAIRMENT: How have these feelings  affected your ability to do daily activities? Have you had more difficulty than usual doing your normal daily activities? (e.g., getting better, same, worse; self-care, school, work, interactions)     Saturday , thinks about panic attack  6. HISTORY: Have you felt this way before? Have you ever been diagnosed with an anxiety problem in the past? (e.g., generalized anxiety disorder, panic attacks, PTSD). If Yes, ask: How was this problem treated? (e.g., medicines, counseling, etc.)     Yes but not with panic attack 7. RISK OF HARM - SUICIDAL IDEATION: Do you ever have thoughts of hurting or killing yourself? If Yes, ask:  Do you have these feelings now? Do you have a plan on how you would do this?     no 8. TREATMENT:  What has been done so far to treat this anxiety? (e.g., medicines, relaxation strategies). What has helped?     Took one of wife's xanax and helped 9. THERAPIST: Do you have a counselor or therapist? If Yes, ask: What is their name?     na 10. POTENTIAL TRIGGERS: Do you drink caffeinated beverages (e.g., coffee, colas, teas), and how much daily? Do you drink alcohol or use any drugs? Have you started any new medicines recently?       na 11. PATIENT SUPPORT: Who is with you now? Who do you live with? Do you have family or friends who you can talk to?  Wife  12. OTHER SYMPTOMS: Do you have any other symptoms? (e.g., feeling depressed, trouble concentrating, trouble sleeping, trouble breathing, palpitations or fast heartbeat, chest pain, sweating, nausea, or diarrhea)       Moderate anxiety now , feels anxious panic attack with happen again. Saturday panic attack chest tightness, heart fast beat hands feet lips numb. Denies sx now but reports still feels funny. 13. PREGNANCY: Is there any chance you are pregnant? When was your last menstrual period?       na  Protocols used: Anxiety and Panic Attack-A-AH

## 2024-05-06 ENCOUNTER — Encounter: Payer: Self-pay | Admitting: Family Medicine

## 2024-05-06 ENCOUNTER — Ambulatory Visit: Admitting: Family Medicine

## 2024-05-06 VITALS — BP 146/92 | HR 89 | Ht 75.0 in | Wt 213.1 lb

## 2024-05-06 DIAGNOSIS — I10 Essential (primary) hypertension: Secondary | ICD-10-CM | POA: Diagnosis not present

## 2024-05-06 DIAGNOSIS — F419 Anxiety disorder, unspecified: Secondary | ICD-10-CM | POA: Insufficient documentation

## 2024-05-06 DIAGNOSIS — F410 Panic disorder [episodic paroxysmal anxiety] without agoraphobia: Secondary | ICD-10-CM

## 2024-05-06 MED ORDER — CLONAZEPAM 0.25 MG PO TBDP: 0.2500 mg | ORAL_TABLET | Freq: Every day | ORAL | 0 refills | Status: AC | PRN

## 2024-05-06 MED ORDER — ESCITALOPRAM OXALATE 10 MG PO TABS: 10.0000 mg | ORAL_TABLET | Freq: Every day | ORAL | 2 refills | Status: AC

## 2024-05-06 NOTE — Patient Instructions (Signed)
 It was nice to see you today,  We addressed the following topics today: -I have sent in a prescription for Lexapro  to take daily. - I have also sent in a prescription for oral dissolving Klonopin  to take as needed. - I would recommend also reaching out to the [facility] to schedule therapist appointments. - Prior to your next visit we will recheck some of your labs and can discuss them at your next visit.  Have a great day,  Rolan Slain, MD

## 2024-05-06 NOTE — Assessment & Plan Note (Signed)
-   History of anxiety and panic attacks, with a recent severe episode after several years without one. Reports significant daily anxiety impacting function. Symptoms are both psychological and physical. Took wife's alprazolam with benefit. Discussed treatment options, including SSRIs for daily management and benzodiazepines or propranolol for as-needed use. Discussed risks of dependence with benzodiazepines. - Start escitalopram  (Lexapro ) for daily anxiety management. - Prescribe clonazepam  0.25 mg sublingual as needed for panic attacks. Sent prescription for 15 tablets. - Discussed non-pharmacologic management, including therapy. Advised to contact insurance for a list of covered providers or use [email or web address] to find a therapist. May also utilize [facility] for therapy services. - Plan for lab work before next visit, including A1C and a test to rule out other causes of symptoms. - Follow up via virtual visit in 4-6 weeks to assess response to medication. - Counseled to message if any issues with medications arise before follow-up.

## 2024-05-06 NOTE — Progress Notes (Unsigned)
 Acute Office Visit  Subjective:     Patient ID: Jerry Dudley, male    DOB: Jan 15, 1986, 38 y.o.   MRN: [id number]  Chief Complaint  Patient presents with   Panic Attack    HPI Patient is in today for   Subjective - Reports recent severe panic attack on Friday night, the first in 7-8 years. Symptoms included chest tightness, perioral numbness, extremity numbness, and feeling of having a heart attack. Episode lasted about one hour. - Describes ongoing, daily anxiety for the past month, with a longer history of intermittent anxiety for years, beginning after [employer] service ~10 years ago. Current symptoms include constant anxiety, brain fog, difficulty focusing, butterflies, and weak legs. - Concerned about the cause of symptoms, questioning if it is related to blood sugar levels. - Denies depression. - Reports taking his wife's Xanax 0.5 mg during the recent panic attack, which was helpful. - Expressed desire for a daily medication for long-term anxiety management and an as-needed medication for acute panic episodes. - [facility] but has not seen a therapist since.  Medications: Sertraline (previously, by [facility], discontinued), cholesterol medication (unspecified), blood pressure medication (unspecified). Reports one-time use of wife's Xanax 0.5 mg.  PMH, PSH, FH, Social Hx: PMHx: Anxiety, panic attacks, hyperlipidemia, hypertension, prediabetes (A1C was 6.0 one year ago). PSH: None mentioned. FH: Wife has anxiety and is prescribed sertraline and Xanax. Social Hx: Veteran. Works in Airline [employer]. Has children. Reports not being a drinker. Expresses concern about medication dependence.  ROS: Pertinent positives: chest tightness, perioral and extremity numbness, dizziness, weakness, brain fog, and constant anxiety. Pertinent negatives: denies depression.    ROS      Objective:    BP (!) 146/92   Pulse 89   Ht 6' 3 (1.905 m)   Wt 213 lb 1.9  oz (96.7 kg)   SpO2 100%   BMI 26.64 kg/m  {Vitals History (Optional):23777}  Physical Exam Gen: alert, oriented Pulm: no resp distress Psych: pleasant affect  No results found for any visits on 05/06/24.      Assessment & Plan:   Panic disorder (episodic paroxysmal anxiety) -     Metanephrines, plasma; Future  Essential hypertension -     Metanephrines, plasma; Future  Anxiety disorder with panic attacks Assessment & Plan: - History of anxiety and panic attacks, with a recent severe episode after several years without one. Reports significant daily anxiety impacting function. Symptoms are both psychological and physical. Took wife's alprazolam with benefit. Discussed treatment options, including SSRIs for daily management and benzodiazepines or propranolol for as-needed use. Discussed risks of dependence with benzodiazepines. - Start escitalopram  (Lexapro ) for daily anxiety management. - Prescribe clonazepam  0.25 mg sublingual as needed for panic attacks. Sent prescription for 15 tablets. - Discussed non-pharmacologic management, including therapy. Advised to contact insurance for a list of covered providers or use [email or web address] to find a therapist. May also utilize [facility] for therapy services. - Plan for lab work before next visit, including A1C and a test to rule out other causes of symptoms. - Follow up via virtual visit in 4-6 weeks to assess response to medication. - Counseled to message if any issues with medications arise before follow-up.   Other orders -     Escitalopram  Oxalate; Take 1 tablet (10 mg total) by mouth daily.  Dispense: 30 tablet; Refill: 2 -     clonazePAM ; Take 1 tablet (0.25 mg total) by mouth daily as needed (panic  episode).  Dispense: 15 tablet; Refill: 0     Return in about 5 weeks (around 06/10/2024) for anxiety/panic - can be virtual.  Toribio MARLA Slain, MD

## 2024-05-13 ENCOUNTER — Ambulatory Visit: Payer: Self-pay

## 2024-05-13 NOTE — Telephone Encounter (Signed)
*  Pt wanting to know if he should take tonight's dose of Lexapro   FYI Only or Action Required?: Action required by provider: clinical question for provider.  Patient was last seen in primary care on 05/06/2024 by Chandra Toribio POUR, MD.  Called Nurse Triage reporting Medication Problem.  Symptoms began Sunday .  Interventions attempted: Nothing.  Symptoms are: unchanged.  Triage Disposition: See PCP When Office is Open (Within 3 Days)  Patient/caregiver understands and will follow disposition?: Yes           Copied from CRM #[id number]. Topic: Clinical - Red Word Triage >> May 13, 2024  8:33 AM Kevelyn M wrote: Red Word that prompted transfer to Nurse Triage: Started medication last week (Tuesday) (lexapro ). Begin feeling light headed this past Sunday and it comes and goes. He said he read it is a common side effect but he's not sure. Reason for Disposition  Prescription request for new medicine (not a refill)  Answer Assessment - Initial Assessment Questions 1. NAME of MEDICINE: What medicine(s) are you calling about?     Lexapro   2. QUESTION: What is your question? (e.g., double dose of medicine, side effect)     Lightheaded in am eases as the day goes on  3. PRESCRIBER: Who prescribed the medicine? Reason: if prescribed by specialist, call should be referred to that group.     *No Answer* 4. SYMPTOMS: Do you have any symptoms? If Yes, ask: What symptoms are you having?  How bad are the symptoms (e.g., mild, moderate, severe)     lightheadedness  Protocols used: Medication Question Call-A-AH

## 2024-05-13 NOTE — Telephone Encounter (Signed)
 Spoke with pt.  He will continue to take it tonight and we will discuss it more tomorrow at his appt.

## 2024-05-14 ENCOUNTER — Encounter: Payer: Self-pay | Admitting: Family Medicine

## 2024-05-14 ENCOUNTER — Ambulatory Visit (INDEPENDENT_AMBULATORY_CARE_PROVIDER_SITE_OTHER): Admitting: Family Medicine

## 2024-05-14 VITALS — BP 133/82 | HR 78 | Ht 75.0 in | Wt 209.8 lb

## 2024-05-14 DIAGNOSIS — F419 Anxiety disorder, unspecified: Secondary | ICD-10-CM

## 2024-05-14 NOTE — Patient Instructions (Signed)
 It was nice to see you today,  We addressed the following topics today: - I would recommend staying with the medication to see if the side effects start to wear off. - When I see you again in a month we can talk about your other options if you want to switch at that point.  Have a great day,  Rolan Slain, MD

## 2024-05-14 NOTE — Progress Notes (Signed)
   Established Patient Office Visit  Subjective   Patient ID: Jerry Dudley, male    DOB: 05-Sep-1985  Age: 38 y.o. MRN: [id number]  Chief Complaint  Patient presents with   Medical Management of Chronic Issues    HPI  Subjective - Reports starting a new medication for anxiety and is experiencing side effects. Reports lightheadedness and a brain foggy feeling, which started Sunday. It is worse in the morning and improves throughout the day. Reports the lightheadedness has improved today. Denies fainting. Able to drive.  Medications: Lexapro , started on Tuesday. Clonazepam , used twice as needed. Reports wife's Xanax worked Leisure centre manager than the clonazepam . Sertraline was tried years ago.  PMH, PSH, FH, Social Hx: History of anxiety and panic attacks.  ROS: Constitutional: Denies fainting. Neurological: Reports lightheadedness, dizziness, and a brain foggy feeling.     The ASCVD Risk score (Arnett DK, et al., 2019) failed to calculate for the following reasons:   The 2019 ASCVD risk score is only valid for ages 50 to 43  Health Maintenance Due  Topic Date Due   Hepatitis C Screening  Never done   HPV VACCINES (1 - 3-dose SCDM series) Never done   DTaP/Tdap/Td (3 - Td or Tdap) 10/30/2019   Influenza Vaccine  03/21/2024   COVID-19 Vaccine (1 - 2024-25 season) Never done      Objective:     BP 133/82   Pulse 78   Ht 6' 3 (1.905 m)   Wt 209 lb 12.8 oz (95.2 kg)   SpO2 97%   BMI 26.22 kg/m    Physical Exam Gen: alert, oriented Pulm: no respiratory distress Psych: pleasant affect   No results found for any visits on 05/14/24.      Assessment & Plan:   Anxiety disorder with panic attacks Assessment & Plan: - Experiencing lightheadedness and brain fog after starting Lexapro  for anxiety. Reports symptoms are improving. This is the second SSRI tried after a remote history of sertraline use. Also uses clonazepam  PRN for panic attacks. - Continue taking  Lexapro  for another 1-2 weeks to see if side effects resolve. - If symptoms do not improve or are intolerable, will consider alternative medications such as bupropion, an SNRI, buspirone, or propranolol. - Follow up in a few weeks with blood work as previously scheduled. - Advised to message or call if symptoms worsen or do not improve.      Return for already on file.    Toribio MARLA Slain, MD

## 2024-05-14 NOTE — Assessment & Plan Note (Signed)
-   Experiencing lightheadedness and brain fog after starting Lexapro  for anxiety. Reports symptoms are improving. This is the second SSRI tried after a remote history of sertraline use. Also uses clonazepam  PRN for panic attacks. - Continue taking Lexapro  for another 1-2 weeks to see if side effects resolve. - If symptoms do not improve or are intolerable, will consider alternative medications such as bupropion, an SNRI, buspirone, or propranolol. - Follow up in a few weeks with blood work as previously scheduled. - Advised to message or call if symptoms worsen or do not improve.

## 2024-06-02 NOTE — Addendum Note (Signed)
 Addended by: CHANDRA TORIBIO POUR on: 06/02/2024 07:35 AM   Modules accepted: Orders

## 2024-06-03 ENCOUNTER — Other Ambulatory Visit

## 2024-06-03 DIAGNOSIS — I10 Essential (primary) hypertension: Secondary | ICD-10-CM

## 2024-06-04 LAB — LIPID PANEL
Chol/HDL Ratio: 3.3 ratio (ref 0.0–5.0)
Cholesterol, Total: 170 mg/dL (ref 100–199)
HDL: 52 mg/dL (ref >39)
LDL Chol Calc (NIH): 100 mg/dL — ABNORMAL HIGH (ref 0–99)
Triglycerides: 96 mg/dL (ref 0–149)
VLDL Cholesterol Cal: 18 mg/dL (ref 5–40)

## 2024-06-04 LAB — COMPREHENSIVE METABOLIC PANEL WITH GFR
ALT: 30 IU/L (ref 0–44)
AST: 20 IU/L (ref 0–40)
Albumin: 5 g/dL (ref 4.1–5.1)
Alkaline Phosphatase: 58 IU/L (ref 47–123)
BUN/Creatinine Ratio: 18 (ref 9–20)
BUN: 13 mg/dL (ref 6–20)
Bilirubin Total: 0.4 mg/dL (ref 0.0–1.2)
CO2: 23 mmol/L (ref 20–29)
Calcium: 10.1 mg/dL (ref 8.7–10.2)
Chloride: 104 mmol/L (ref 96–106)
Creatinine, Ser: 0.74 mg/dL — ABNORMAL LOW (ref 0.76–1.27)
Globulin, Total: 2.4 g/dL (ref 1.5–4.5)
Glucose: 102 mg/dL — ABNORMAL HIGH (ref 70–99)
Potassium: 4.9 mmol/L (ref 3.5–5.2)
Sodium: 141 mmol/L (ref 134–144)
Total Protein: 7.4 g/dL (ref 6.0–8.5)
eGFR: 120 mL/min/1.73 (ref >59)

## 2024-06-04 LAB — HEMOGLOBIN A1C
Est. average glucose Bld gHb Est-mCnc: 117 mg/dL
Hgb A1c MFr Bld: 5.7 % — ABNORMAL HIGH (ref 4.8–5.6)

## 2024-06-04 LAB — TSH: TSH: 1.63 u[IU]/mL (ref 0.450–4.500)

## 2024-06-10 ENCOUNTER — Telehealth: Admitting: Family Medicine

## 2024-06-10 ENCOUNTER — Encounter: Payer: Self-pay | Admitting: Family Medicine

## 2024-06-10 VITALS — Ht 75.0 in | Wt 209.0 lb

## 2024-06-10 DIAGNOSIS — F419 Anxiety disorder, unspecified: Secondary | ICD-10-CM

## 2024-06-10 DIAGNOSIS — E785 Hyperlipidemia, unspecified: Secondary | ICD-10-CM

## 2024-06-10 NOTE — Progress Notes (Signed)
   Established Patient Office Visit  Subjective   Patient ID: Jerry Dudley, male    DOB: 19-Dec-1985  Age: 38 y.o. MRN: [id number]  No chief complaint on file.   HPI Pt location: home Provider location: [facility] Method of visit: video Duration: 20 min  Pt has no complaints.  His lightheaded symptoms from when he initially started his ssri are resolved.  Wants to continue current dose.    We discussed his labs which were all improved from previous values.     The ASCVD Risk score (Arnett DK, et al., 2019) failed to calculate for the following reasons:   The 2019 ASCVD risk score is only valid for ages 63 to 52  Health Maintenance Due  Topic Date Due   Hepatitis C Screening  Never done   HPV VACCINES (1 - 3-dose SCDM series) Never done   DTaP/Tdap/Td (3 - Td or Tdap) 10/30/2019   Influenza Vaccine  03/21/2024   COVID-19 Vaccine (1 - 2025-26 season) Never done      Objective:     Ht 6' 3 (1.905 m)   Wt 209 lb (94.8 kg)   BMI 26.12 kg/m    Physical Exam Gen: alert, oriented Pulm: speaking in full sentences without difficulty Psych: pleasant affect   No results found for any visits on 06/10/24.      Assessment & Plan:   Hyperlipidemia LDL goal <100 Assessment & Plan: LDL right at 100.  Will hold off on increasing statin.  Recheck in 6 months   Anxiety disorder with panic attacks Assessment & Plan: Side effects now resolved.  Continue lexapro  10mg .  F/u 6 months      Return in about 6 months (around 12/09/2024) for mood, hld.    Jerry MARLA Slain, MD

## 2024-06-10 NOTE — Assessment & Plan Note (Signed)
 LDL right at 100.  Will hold off on increasing statin.  Recheck in 6 months

## 2024-06-10 NOTE — Assessment & Plan Note (Signed)
 Side effects now resolved.  Continue lexapro  10mg .  F/u 6 months

## 2024-08-08 ENCOUNTER — Other Ambulatory Visit: Payer: Self-pay | Admitting: Family Medicine

## 2024-08-12 ENCOUNTER — Telehealth: Admitting: Family Medicine

## 2024-08-12 DIAGNOSIS — J020 Streptococcal pharyngitis: Secondary | ICD-10-CM | POA: Diagnosis not present

## 2024-08-12 MED ORDER — AMOXICILLIN 875 MG PO TABS: 875.0000 mg | ORAL_TABLET | Freq: Two times a day (BID) | ORAL | 0 refills | Status: AC

## 2024-08-12 NOTE — Progress Notes (Signed)
 " Virtual Visit Consent   Jerry Dudley, you are scheduled for a virtual visit with a [facility] provider today. Just as with appointments in the office, your consent must be obtained to participate. Your consent will be active for this visit and any virtual visit you may have with one of our providers in the next 365 days. If you have a MyChart account, a copy of this consent can be sent to you electronically.  As this is a virtual visit, video technology does not allow for your provider to perform a traditional examination. This may limit your provider's ability to fully assess your condition. If your provider identifies any concerns that need to be evaluated in person or the need to arrange testing (such as labs, EKG, etc.), we will make arrangements to do so. Although advances in technology are sophisticated, we cannot ensure that it will always work on either your end or our end. If the connection with a video visit is poor, the visit may have to be switched to a telephone visit. With either a video or telephone visit, we are not always able to ensure that we have a secure connection.  By engaging in this virtual visit, you consent to the provision of healthcare and authorize for your insurance to be billed (if applicable) for the services provided during this visit. Depending on your insurance coverage, you may receive a charge related to this service.  I need to obtain your verbal consent now. Are you willing to proceed with your visit today? Jerry Dudley has provided verbal consent on 08/12/2024 for a virtual visit (video or telephone). Loa Lamp, FNP  Date: 08/12/2024 3:01 PM   Virtual Visit via Video Note   I, Loa Lamp, connected with  Jerry Dudley  ([id number], September 16, 1985) on 08/12/2024 at  3:00 PM EST by a video-enabled telemedicine application and verified that I am speaking with the correct person using two identifiers.  Location: Patient: Virtual Visit Location Patient:  Home Provider: Virtual Visit Location Provider: Home Office   I discussed the limitations of evaluation and management by telemedicine and the availability of in person appointments. The patient expressed understanding and agreed to proceed.    History of Present Illness: Jerry Dudley is a 38 y.o. who identifies as a male who was assigned male at birth, and is being seen today for fever, sore throat and headache, family have strep throat. Jerry Dudley  HPI: HPI  Problems:  Patient Active Problem List   Diagnosis Date Noted   Anxiety disorder with panic attacks 05/06/2024   Hepatic steatosis 07/30/2023   Prediabetes 07/30/2023   Dyspepsia 07/02/2023   Renal lesion 07/02/2023   Other fatigue 04/25/2021   Hyperlipidemia LDL goal <100 04/14/2021   Posttraumatic stress disorder 04/14/2021   Pure hypercholesterolemia 04/14/2021   Encounter to establish care 03/27/2021   Body mass index 26.0-26.9, adult 03/27/2021   Essential hypertension 09/27/2017    Allergies: Allergies[1] Medications: Current Medications[2]  Observations/Objective: Patient is well-developed, well-nourished in no acute distress.  Resting comfortably  at home.  Head is normocephalic, atraumatic.  No labored breathing.  Speech is clear and coherent with logical content.  Patient is alert and oriented at baseline.    Assessment and Plan: 1. Strep throat (Primary)  Increase fluids, warm salt water gargles, ibuprofen as directed, UC as needed.   Follow Up Instructions: I discussed the assessment and treatment plan with the patient. The patient was provided an opportunity to ask questions and all were answered. The  patient agreed with the plan and demonstrated an understanding of the instructions.  A copy of instructions were sent to the patient via MyChart unless otherwise noted below.     The patient was advised to call back or seek an in-person evaluation if the symptoms worsen or if the condition fails to improve as  anticipated.    Jerry Heyward, FNP     [1] No Known Allergies [2]  Current Outpatient Medications:    clonazePAM  (KLONOPIN ) 0.25 MG disintegrating tablet, Take 1 tablet (0.25 mg total) by mouth daily as needed (panic episode)., Disp: 15 tablet, Rfl: 0   escitalopram  (LEXAPRO ) 10 MG tablet, TAKE 1 TABLET (10 MG TOTAL) BY MOUTH DAILY., Disp: 30 tablet, Rfl: 2   hyoscyamine  (LEVSIN /SL) 0.125 MG SL tablet, Place 1 tablet (0.125 mg total) under the tongue every 4 (four) hours as needed., Disp: 30 tablet, Rfl: 0   losartan  (COZAAR ) 50 MG tablet, Take 1 tablet (50 mg total) by mouth daily., Disp: 90 tablet, Rfl: 1   omeprazole  (PRILOSEC) 20 MG capsule, Take 1 capsule (20 mg total) by mouth daily., Disp: 30 capsule, Rfl: 0   Polyethyl Glycol-Propyl Glycol 0.4-0.3 % SOLN, Place 1 drop into both eyes 3 (three) times daily as needed (for dry/irritated eyes.)., Disp: , Rfl:    rosuvastatin  (CRESTOR ) 10 MG tablet, Take 1 tablet (10 mg total) by mouth daily., Disp: 90 tablet, Rfl: 1  "

## 2024-08-12 NOTE — Patient Instructions (Signed)
